# Patient Record
Sex: Female | Born: 1940 | Race: White | Hispanic: No | Marital: Married | Smoking: Never smoker
Health system: Southern US, Community
[De-identification: ages and names within clinical notes are randomized; demographics above are authoritative.]

## PROBLEM LIST (undated history)

## (undated) DIAGNOSIS — K219 Gastro-esophageal reflux disease without esophagitis: Secondary | ICD-10-CM

## (undated) DIAGNOSIS — C4491 Basal cell carcinoma of skin, unspecified: Secondary | ICD-10-CM

## (undated) DIAGNOSIS — T7840XA Allergy, unspecified, initial encounter: Secondary | ICD-10-CM

## (undated) DIAGNOSIS — M1990 Unspecified osteoarthritis, unspecified site: Secondary | ICD-10-CM

## (undated) DIAGNOSIS — H269 Unspecified cataract: Secondary | ICD-10-CM

## (undated) DIAGNOSIS — G4730 Sleep apnea, unspecified: Secondary | ICD-10-CM

## (undated) DIAGNOSIS — I10 Essential (primary) hypertension: Secondary | ICD-10-CM

## (undated) DIAGNOSIS — L570 Actinic keratosis: Secondary | ICD-10-CM

## (undated) DIAGNOSIS — M4800 Spinal stenosis, site unspecified: Secondary | ICD-10-CM

## (undated) HISTORY — DX: Allergy, unspecified, initial encounter: T78.40XA

## (undated) HISTORY — DX: Unspecified cataract: H26.9

## (undated) HISTORY — PX: FOOT SURGERY: SHX648

## (undated) HISTORY — DX: Gastro-esophageal reflux disease without esophagitis: K21.9

## (undated) HISTORY — DX: Actinic keratosis: L57.0

## (undated) HISTORY — DX: Basal cell carcinoma of skin, unspecified: C44.91

## (undated) HISTORY — PX: BREAST CYST ASPIRATION: SHX578

## (undated) HISTORY — DX: Sleep apnea, unspecified: G47.30

## (undated) HISTORY — PX: OTHER SURGICAL HISTORY: SHX169

## (undated) HISTORY — PX: BREAST SURGERY: SHX581

## (undated) HISTORY — PX: EYE SURGERY: SHX253

## (undated) HISTORY — PX: BREAST EXCISIONAL BIOPSY: SUR124

## (undated) HISTORY — DX: Spinal stenosis, site unspecified: M48.00

## (undated) HISTORY — DX: Unspecified osteoarthritis, unspecified site: M19.90

## (undated) HISTORY — DX: Essential (primary) hypertension: I10

---

## 2000-06-09 DIAGNOSIS — J309 Allergic rhinitis, unspecified: Secondary | ICD-10-CM | POA: Insufficient documentation

## 2000-07-16 DIAGNOSIS — G4730 Sleep apnea, unspecified: Secondary | ICD-10-CM | POA: Insufficient documentation

## 2006-01-21 DIAGNOSIS — E785 Hyperlipidemia, unspecified: Secondary | ICD-10-CM | POA: Insufficient documentation

## 2013-01-26 DIAGNOSIS — K922 Gastrointestinal hemorrhage, unspecified: Secondary | ICD-10-CM | POA: Insufficient documentation

## 2016-04-09 DIAGNOSIS — N281 Cyst of kidney, acquired: Secondary | ICD-10-CM | POA: Insufficient documentation

## 2016-04-09 DIAGNOSIS — J310 Chronic rhinitis: Secondary | ICD-10-CM | POA: Insufficient documentation

## 2016-04-09 DIAGNOSIS — L509 Urticaria, unspecified: Secondary | ICD-10-CM | POA: Insufficient documentation

## 2016-04-09 DIAGNOSIS — K635 Polyp of colon: Secondary | ICD-10-CM | POA: Insufficient documentation

## 2016-04-09 DIAGNOSIS — M8580 Other specified disorders of bone density and structure, unspecified site: Secondary | ICD-10-CM | POA: Insufficient documentation

## 2016-04-09 DIAGNOSIS — I10 Essential (primary) hypertension: Secondary | ICD-10-CM | POA: Insufficient documentation

## 2016-04-09 DIAGNOSIS — M47816 Spondylosis without myelopathy or radiculopathy, lumbar region: Secondary | ICD-10-CM | POA: Insufficient documentation

## 2016-04-09 DIAGNOSIS — M1990 Unspecified osteoarthritis, unspecified site: Secondary | ICD-10-CM | POA: Insufficient documentation

## 2016-05-27 DIAGNOSIS — M5416 Radiculopathy, lumbar region: Secondary | ICD-10-CM | POA: Insufficient documentation

## 2016-07-07 HISTORY — PX: JOINT REPLACEMENT: SHX530

## 2017-04-13 DIAGNOSIS — R609 Edema, unspecified: Secondary | ICD-10-CM | POA: Insufficient documentation

## 2017-06-23 DIAGNOSIS — R55 Syncope and collapse: Secondary | ICD-10-CM | POA: Insufficient documentation

## 2018-01-13 DIAGNOSIS — K5900 Constipation, unspecified: Secondary | ICD-10-CM | POA: Insufficient documentation

## 2018-10-05 ENCOUNTER — Other Ambulatory Visit: Payer: Self-pay

## 2018-10-05 ENCOUNTER — Encounter: Payer: Self-pay | Admitting: Family Medicine

## 2018-10-05 ENCOUNTER — Telehealth: Payer: Self-pay

## 2018-10-05 ENCOUNTER — Ambulatory Visit (INDEPENDENT_AMBULATORY_CARE_PROVIDER_SITE_OTHER): Payer: Medicare Other | Admitting: Family Medicine

## 2018-10-05 VITALS — BP 150/78 | HR 76 | Temp 98.6°F | Ht 64.0 in | Wt 183.0 lb

## 2018-10-05 DIAGNOSIS — Z8739 Personal history of other diseases of the musculoskeletal system and connective tissue: Secondary | ICD-10-CM

## 2018-10-05 DIAGNOSIS — Z96651 Presence of right artificial knee joint: Secondary | ICD-10-CM

## 2018-10-05 DIAGNOSIS — M4800 Spinal stenosis, site unspecified: Secondary | ICD-10-CM | POA: Diagnosis not present

## 2018-10-05 DIAGNOSIS — Z8711 Personal history of peptic ulcer disease: Secondary | ICD-10-CM

## 2018-10-05 DIAGNOSIS — S39012A Strain of muscle, fascia and tendon of lower back, initial encounter: Secondary | ICD-10-CM

## 2018-10-05 DIAGNOSIS — M4316 Spondylolisthesis, lumbar region: Secondary | ICD-10-CM | POA: Diagnosis not present

## 2018-10-05 DIAGNOSIS — M5416 Radiculopathy, lumbar region: Secondary | ICD-10-CM

## 2018-10-05 DIAGNOSIS — I10 Essential (primary) hypertension: Secondary | ICD-10-CM

## 2018-10-05 DIAGNOSIS — G4733 Obstructive sleep apnea (adult) (pediatric): Secondary | ICD-10-CM

## 2018-10-05 DIAGNOSIS — Z8719 Personal history of other diseases of the digestive system: Secondary | ICD-10-CM

## 2018-10-05 MED ORDER — METHOCARBAMOL 500 MG PO TABS: 500.0000 mg | ORAL_TABLET | Freq: Four times a day (QID) | ORAL | 1 refills | Status: DC

## 2018-10-05 NOTE — Progress Notes (Signed)
Michelle Lutz  MRN: [id number] DOB: 10/22/1940  Subjective:  HPI  The patient is a 78 year old female that has just moved to town to be closer to her son.  She has been moving boxes and opening boxes during this time of her move.  She states for 5 days now she has been experiencing upper back/neck/shoulder pain.  She describes it as back pain pain but points to her left shoulder area going toward the area between her shoulder blades. She has prescriptions for Hydrocodone and Tramadol for her knee pain.  She states she took one of the Tramadol last night and it enabled her to roll over in bed and sleep a little better than she had been   Past Surgical History:  Procedure Laterality Date  . BREAST SURGERY    . CESAREAN SECTION    . EYE SURGERY    . FOOT SURGERY    . JOINT REPLACEMENT Right 07/07/2016   Partial R. knee in 2008 then complete in 2018  . uterine prolapse repair     Tacking uterus   Past Medical History:  Diagnosis Date  . Allergy   . Arthritis   . Cataract   . GERD (gastroesophageal reflux disease)   . Hypertension   . Osteoarthritis   . Sleep apnea   . Spinal stenosis    Family History  Problem Relation Age of Onset  . Heart disease Mother   . Kidney disease Mother   . Macular degeneration Mother   . Arthritis Father   . Heart disease Father   . Arthritis Brother   . Heart disease Brother   . Hypertension Brother    Social History   Socioeconomic History  . Marital status: Married    Spouse name: Not on file  . Number of children: 2  . Years of education: Not on file  . Highest education level: Not on file  Occupational History  . 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 Financial resource strain: Not on file  . Food insecurity:    Worry: Not on file    Inability: Not on file  . Transportation needs:    Medical: Not on file    Non-medical: Not on file  Tobacco Use  .  Smoking status: Never Smoker  . Smokeless tobacco: Never Used  Substance and Sexual Activity  . Alcohol use: Not Currently  . Drug use: Never  . Sexual activity: Not on file  Lifestyle  . Physical activity:    Days per week: Not on file    Minutes per session: Not on file  . Stress: Not on file  Relationships  . Social connections:    Talks on phone: Not on file    Gets together: Not on file    Attends religious service: Not on file    Active member of club or organization: Not on file    Attends meetings of clubs or organizations: Not on file    Relationship status: Not on file  . Intimate partner violence:    Fear of current or ex partner: Not on file    Emotionally abused: Not on file    Physically abused: Not on file    Forced sexual activity: Not on file  Other Topics Concern  . Not on file  Social History Narrative  . Not on file   Outpatient Encounter Medications as of 10/05/2018  Medication Sig  . Biotin 1000 MCG tablet Take 1,000 mcg by mouth 3 (three) times daily.  . Calcium Carb-Cholecalciferol (CALCIUM 600 + D PO) Take by mouth.  . carboxymethylcellulose (REFRESH PLUS) 0.5 % SOLN 1 drop 3 (three) times daily as needed.  . cetirizine (ZYRTEC) 10 MG tablet Take 10 mg by mouth daily.  . Cholecalciferol (VITAMIN D-1000 MAX ST PO) Take by mouth.  . fluticasone (FLONASE) 50 MCG/ACT nasal spray Place into both nostrils daily.  . hydrochlorothiazide (MICROZIDE) 12.5 MG capsule Take 12.5 mg by mouth daily.  Marland Kitchen HYDROcodone-acetaminophen (NORCO/VICODIN) 5-325 MG tablet Take 1 tablet by mouth every 6 (six) hours as needed for moderate pain.  . metroNIDAZOLE (METROCREAM) 0.75 % cream Apply topically 2 (two) times daily.  . montelukast (SINGULAIR) 10 MG tablet Take 10 mg by mouth at bedtime.  . Multiple Vitamin (MULTIVITAMIN) capsule Take 1 capsule by mouth daily.  Marland Kitchen omeprazole (PRILOSEC) 20 MG capsule Take 20 mg by mouth daily.  . pravastatin (PRAVACHOL) 40 MG tablet Take 40  mg by mouth daily.  . Probiotic Product (ALIGN) 4 MG CAPS Take by mouth.  . traMADol (ULTRAM) 50 MG tablet Take by mouth every 6 (six) hours as needed.  . verapamil (CALAN-SR) 240 MG CR tablet Take 240 mg by mouth at bedtime.  Dema Severin Petrolatum-Mineral Oil (ARTIFICIAL TEARS) ointment Apply to eye as needed.  . zolpidem (AMBIEN) 5 MG tablet Take 5 mg by mouth at bedtime as needed for sleep.   No facility-administered encounter medications on file as of 10/05/2018.    Allergies  Allergen Reactions  . Zocor [Simvastatin]     Caused pain   Review of Systems  Musculoskeletal: Positive for back pain, joint pain and neck pain.    Objective:  BP (!) 150/78 (BP Location: Right Arm, Patient Position: Sitting, Cuff Size: Normal)   Pulse 76   Temp 98.6 F (37 C) (Oral)   Ht 5\' 4"  (1.626 m)   Wt 183 lb (83 kg)   BMI 31.41 kg/m   Physical Exam  Constitutional: She is oriented to person, place, and time and well-developed, well-nourished, and in no distress.  HENT:  Head: Normocephalic.  Eyes: Conjunctivae are normal.  Neck:  Pain and stiffness to test cervical ROM due to spondylosis with spinal stenosis.  Cardiovascular: Normal rate and regular rhythm.  Pulmonary/Chest: Effort normal and breath sounds normal.  Abdominal: Soft. Bowel sounds are normal.  Musculoskeletal:     Comments: Well healed scar from right knee replacement 07-07-16. Cervical and lumbar pain with radiculopathy. Stiff and aching pains in mid back that is worse with reaching overhead or twisting. Posterior cervical tenderness to palpation into the upper back/posterior shoulder region. Arthritic deformities of fingers diagnosed as osteoarthritis without RA - enlarged PIP joints with stiffness.  Neurological: She is alert and oriented to person, place, and time.  Skin: No rash noted.  Psychiatric: Mood, affect and judgment normal.    Assessment and Plan :   1. Back strain, initial encounter Moved to [place] from [facility] to be  closer to her son recently and has been moving/lifting/opening boxes the past couple weeks.. Having sharp pains in the mid back region that is worse with reaching overhead or twisting the past 5 days. Had some leftover Tramadol (for her spinal stenosis with radiculopathy), took one last night and it allowed her to be able to roll over in bed without as much spasm.Muscular soreness today to examination. Will give muscle relaxer and encouraged to  try Salonpas Lidocaine patches or Aspercreme with Lidocaine (fears NSAID's with history of PUD). Recheck as needed. - methocarbamol (ROBAXIN) 500 MG tablet; Take 1 tablet (500 mg total) by mouth 4 (four) times daily.  Dispense: 30 tablet; Refill: 1  2. Spondylolisthesis of lumbar region Confirmed by MRI and x-ray imaging with spinal stenosis and lumbar radiculopathy.  3. Multilevel spinal stenosis Confirmed on cervical and lumbar MRI's in the past.  4. 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 X-rays in 2005 reported as erosive disease in hands/fingers but negative for RA.  5. Lumbar radiculopathy Had 4 epidural injections in [place] (last one 04-05-18). Feels stable and tolerable now.Presently controlled with Gabapentin BID (can't remember exact dosage and did not bring medications today).  6. History of total knee arthroplasty, right Had partial right knee replacement in 2008 then total arthroplasty on 07-07-26. No significant discomfort in the knee now.  7. Obstructive sleep apnea syndrome Diagnosed in 2002. Had follow up sleep study 03-31-18 and restarted the CPAP with auto-titration 06-08-18.  8. Essential hypertension Tolerating Verapamil-SR 240 mg at bedtime, HCTZ 12.5 mg qd and Doxazocin qd (can't remember dosage and did not bring it with her today).

## 2018-10-05 NOTE — Telephone Encounter (Signed)
Sorry, I overlooked it at lunch. Just sent it to the [facility].

## 2018-10-05 NOTE — Telephone Encounter (Signed)
Patient called and stated Michelle Lutz suppose to had send in a muscle relaxer in for her and the pharmacy stated they have not received any medication orders. She was seen in office today 10/05/2018. Please advise.

## 2018-10-06 NOTE — Telephone Encounter (Signed)
Patient was advised that medication was send into pharmacy. 

## 2018-10-07 ENCOUNTER — Encounter: Payer: Self-pay | Admitting: Family Medicine

## 2018-10-07 DIAGNOSIS — Z8719 Personal history of other diseases of the digestive system: Secondary | ICD-10-CM | POA: Insufficient documentation

## 2018-10-07 DIAGNOSIS — M4800 Spinal stenosis, site unspecified: Secondary | ICD-10-CM | POA: Insufficient documentation

## 2018-10-07 DIAGNOSIS — Z8739 Personal history of other diseases of the musculoskeletal system and connective tissue: Secondary | ICD-10-CM | POA: Insufficient documentation

## 2018-10-07 DIAGNOSIS — Z8711 Personal history of peptic ulcer disease: Secondary | ICD-10-CM | POA: Insufficient documentation

## 2018-10-07 DIAGNOSIS — M5416 Radiculopathy, lumbar region: Secondary | ICD-10-CM | POA: Insufficient documentation

## 2018-10-07 DIAGNOSIS — Z96651 Presence of right artificial knee joint: Secondary | ICD-10-CM | POA: Insufficient documentation

## 2019-02-08 ENCOUNTER — Ambulatory Visit (INDEPENDENT_AMBULATORY_CARE_PROVIDER_SITE_OTHER): Payer: Medicare Other | Admitting: Family Medicine

## 2019-02-08 ENCOUNTER — Encounter: Payer: Self-pay | Admitting: Family Medicine

## 2019-02-08 VITALS — BP 128/84 | HR 77 | Temp 96.9°F | Resp 18 | Wt 178.4 lb

## 2019-02-08 DIAGNOSIS — Z6830 Body mass index (BMI) 30.0-30.9, adult: Secondary | ICD-10-CM

## 2019-02-08 DIAGNOSIS — Z1211 Encounter for screening for malignant neoplasm of colon: Secondary | ICD-10-CM

## 2019-02-08 DIAGNOSIS — I10 Essential (primary) hypertension: Secondary | ICD-10-CM

## 2019-02-08 DIAGNOSIS — E785 Hyperlipidemia, unspecified: Secondary | ICD-10-CM

## 2019-02-08 DIAGNOSIS — M4800 Spinal stenosis, site unspecified: Secondary | ICD-10-CM

## 2019-02-08 DIAGNOSIS — Z23 Encounter for immunization: Secondary | ICD-10-CM

## 2019-02-08 DIAGNOSIS — H9191 Unspecified hearing loss, right ear: Secondary | ICD-10-CM

## 2019-02-08 DIAGNOSIS — E6609 Other obesity due to excess calories: Secondary | ICD-10-CM

## 2019-02-08 DIAGNOSIS — Z0000 Encounter for general adult medical examination without abnormal findings: Secondary | ICD-10-CM

## 2019-02-08 NOTE — Progress Notes (Signed)
Patient: Michelle Lutz, Female    DOB: 15-Jun-1940, 78 y.o.   MRN: [id number] Visit Date: 02/08/2019  Today's Provider: Wilhemena Durie, MD   Chief Complaint  Patient presents with  . Annual Exam   Subjective:     Annual physical exam Michelle Lutz is a 78 y.o. female who presents today for health maintenance and complete physical. She feels well today. She reports exercising by walking a few times a week. She reports she is sleeping fairly well. Last colonoscopy Nov 2010. -----------------------------------------------------------------   Review of Systems  Constitutional: Negative.   HENT: Positive for hearing loss.        Left ear hearing loss.  Eyes: Negative.   Respiratory: Negative.   Cardiovascular: Negative.   Gastrointestinal: Negative.   Endocrine: Negative.   Genitourinary: Negative.   Musculoskeletal: Positive for back pain.  Allergic/Immunologic: Negative.   Neurological: Negative.   Hematological: Negative.   Psychiatric/Behavioral: Negative.     Social History      She  reports that she has never smoked. She has never used smokeless tobacco. She reports previous alcohol use. She reports that she does not use drugs.       Social History   Socioeconomic History  . Marital status: Married    Spouse name: Not on file  . Number of children: 2  . Years of education: Not on file  . Highest education level: Not on file  Occupational History  . 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 Financial resource strain: Not on file  . Food insecurity    Worry: Not on file    Inability: Not on file  . Transportation needs    Medical: Not on file    Non-medical: Not on file  Tobacco Use  . Smoking status: Never Smoker  . Smokeless tobacco: Never Used  Substance and Sexual Activity  . Alcohol use: Not Currently  . Drug use: Never  . Sexual activity: Not on file  Lifestyle  .  Physical activity    Days per week: Not on file    Minutes per session: Not on file  . Stress: Not on file  Relationships  . Social Herbalist on phone: Not on file    Gets together: Not on file    Attends religious service: Not on file    Active member of club or organization: Not on file    Attends meetings of clubs or organizations: Not on file    Relationship status: Not on file  Other Topics Concern  . Not on file  Social History Narrative  . Not on file    Past Medical History:  Diagnosis Date  . Allergy   . Arthritis   . Cataract   . GERD (gastroesophageal reflux disease)   . Hypertension   . Osteoarthritis   . Sleep apnea   . Spinal stenosis      Patient Active Problem List   Diagnosis Date Noted  . Spondylolisthesis of lumbar region 10/07/2018  . 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 History of arthritis 10/07/2018  . Lumbar radiculopathy 10/07/2018  . History of total knee arthroplasty, right 10/07/2018  . Personal history of gastric ulcer 10/07/2018  . Sleep apnea 10/07/2018  . Essential hypertension 10/07/2018  Pt is G105P2 with a stillborn daughter after PROM. 2 healthy sons.  Past Surgical History:  Procedure  Laterality Date  . BREAST SURGERY    . CESAREAN SECTION    . EYE SURGERY    . FOOT SURGERY    . JOINT REPLACEMENT Right 07/07/2016   Partial R. knee in 2008 then complete in 2018  . uterine prolapse repair     Tacking uterus    Family History        Family Status  Relation Name Status  . Mother  Deceased  . Father  Deceased  . Brother  (Not Specified)        Her family history includes Arthritis in her brother and father; COPD in her father; Heart disease in her brother, father, and mother; Hypertension in her brother; Kidney disease in her mother; Macular degeneration in her mother.      Allergies  Allergen Reactions  . Benazepril Cough  . Zocor [Simvastatin]     Caused pain     Current Outpatient Medications:   .  Biotin 1000 MCG tablet, Take 1,000 mcg by mouth 3 (three) times daily., Disp: , Rfl:  .  Calcium Carb-Cholecalciferol (CALCIUM 600 + D PO), Take by mouth., Disp: , Rfl:  .  carboxymethylcellulose (REFRESH PLUS) 0.5 % SOLN, 1 drop 3 (three) times daily as needed., Disp: , Rfl:  .  cetirizine (ZYRTEC) 10 MG tablet, Take 10 mg by mouth daily., Disp: , Rfl:  .  Cholecalciferol (VITAMIN D-1000 MAX ST PO), Take by mouth., Disp: , Rfl:  .  doxazosin (CARDURA) 2 MG tablet, Take 2 mg by mouth daily., Disp: , Rfl:  .  fluticasone (FLONASE) 50 MCG/ACT nasal spray, Place into both nostrils daily., Disp: , Rfl:  .  gabapentin (NEURONTIN) 100 MG capsule, Take 100 mg by mouth 2 (two) times daily., Disp: , Rfl:  .  hydrochlorothiazide (MICROZIDE) 12.5 MG capsule, Take 12.5 mg by mouth daily., Disp: , Rfl:  .  montelukast (SINGULAIR) 10 MG tablet, Take 10 mg by mouth at bedtime., Disp: , Rfl:  .  Multiple Vitamin (MULTIVITAMIN) capsule, Take 1 capsule by mouth daily., Disp: , Rfl:  .  omeprazole (PRILOSEC) 20 MG capsule, Take 20 mg by mouth daily., Disp: , Rfl:  .  pravastatin (PRAVACHOL) 40 MG tablet, Take 40 mg by mouth daily., Disp: , Rfl:  .  Probiotic Product (ALIGN) 4 MG CAPS, Take by mouth., Disp: , Rfl:  .  sodium chloride (MURO 128) 5 % ophthalmic solution, 1 drop as needed for eye irritation., Disp: , Rfl:  .  traMADol (ULTRAM) 50 MG tablet, Take by mouth every 6 (six) hours as needed., Disp: , Rfl:  .  White Petrolatum-Mineral Oil (ARTIFICIAL TEARS) ointment, Apply to eye as needed., Disp: , Rfl:  .  HYDROcodone-acetaminophen (NORCO/VICODIN) 5-325 MG tablet, Take 1 tablet by mouth every 6 (six) hours as needed for moderate pain., Disp: , Rfl:  .  methocarbamol (ROBAXIN) 500 MG tablet, Take 1 tablet (500 mg total) by mouth 4 (four) times daily. (Patient not taking: Reported on 02/08/2019), Disp: 30 tablet, Rfl: 1 .  verapamil (CALAN-SR) 240 MG CR tablet, Take 240 mg by mouth at bedtime., Disp: , Rfl:   .  zolpidem (AMBIEN) 5 MG tablet, Take 5 mg by mouth at bedtime as needed for sleep., Disp: , Rfl:    Patient Care Team: Chrismon, Vickki Muff, PA as PCP - General (Family Medicine)    Objective:    Vitals: BP 128/84 (BP Location: Left Arm, Patient Position: Sitting, Cuff Size: Large)   Pulse 77  Temp (!) 96.9 F (36.1 C) (Temporal)   Resp 18   Wt 178 lb 6.4 oz (80.9 kg)   SpO2 97%   BMI 30.62 kg/m    Vitals:   02/08/19 0854  BP: 128/84  Pulse: 77  Resp: 18  Temp: (!) 96.9 F (36.1 C)  TempSrc: Temporal  SpO2: 97%  Weight: 178 lb 6.4 oz (80.9 kg)     Physical Exam Vitals signs reviewed.  Constitutional:      Appearance: She is obese.  HENT:     Head: Normocephalic and atraumatic.     Right Ear: External ear normal.     Left Ear: External ear normal.  Eyes:     General: No scleral icterus.    Conjunctiva/sclera: Conjunctivae normal.  Cardiovascular:     Rate and Rhythm: Normal rate and regular rhythm.     Heart sounds: Normal heart sounds.  Pulmonary:     Effort: Pulmonary effort is normal.     Breath sounds: Normal breath sounds.  Chest:     Breasts:        Right: Normal.        Left: Normal.  Abdominal:     Palpations: Abdomen is soft.  Lymphadenopathy:     Cervical: No cervical adenopathy.  Skin:    General: Skin is warm and dry.  Neurological:     General: No focal deficit present.     Mental Status: She is alert and oriented to person, place, and time.  Psychiatric:        Mood and Affect: Mood normal.        Behavior: Behavior normal.        Thought Content: Thought content normal.        Judgment: Judgment normal.      Depression Screen PHQ 2/9 Scores 02/08/2019 10/05/2018  PHQ - 2 Score 0 0  PHQ- 9 Score 0 -       Assessment & Plan:     Routine Health Maintenance and Physical Exam  Exercise Activities and Dietary recommendations Goals   None      There is no immunization history on file for this patient.  Health  Maintenance  Topic Date Due  . TETANUS/TDAP  07/26/1960  . DEXA SCAN  07/27/2006  . PNA vac Low Risk Adult (1 of 2 - PCV13) 07/27/2006  . INFLUENZA VACCINE  12/25/2018     Discussed health benefits of physical activity, and encouraged her to engage in regular exercise appropriate for her age and condition.   1. Annual physical exam G3P3. - TSH  2. Need for immunization against influenza  - Flu vaccine HIGH DOSE PF  3. Encounter for screening colonoscopy Last was November 2010 - Ambulatory referral to Gastroenterology  4. Spinal stenosis, unspecified spinal region Refer to Pain clinic as she has benefited from shots before. - Ambulatory referral to Pain Clinic  5. Hearing loss of right ear, unspecified hearing loss type  - Ambulatory referral to ENT  6. Essential hypertension  - CBC w/Diff/Platelet - Comp. Metabolic Panel (12) - Lipid Profile  7. Hyperlipidemia, unspecified hyperlipidemia type  - Comp. Metabolic Panel (12) - Lipid Profile 8.Obesity With HTN/LBP/HLD --------------------------------------------------------------------    Wilhemena Durie, MD  [facility]

## 2019-02-09 LAB — COMP. METABOLIC PANEL (12)
AST: 16 IU/L (ref 0–40)
Albumin/Globulin Ratio: 1.9 (ref 1.2–2.2)
Albumin: 4.3 g/dL (ref 3.7–4.7)
Alkaline Phosphatase: 77 IU/L (ref 39–117)
BUN/Creatinine Ratio: 19 (ref 12–28)
BUN: 15 mg/dL (ref 8–27)
Bilirubin Total: 0.5 mg/dL (ref 0.0–1.2)
Calcium: 9.2 mg/dL (ref 8.7–10.3)
Chloride: 102 mmol/L (ref 96–106)
Creatinine, Ser: 0.81 mg/dL (ref 0.57–1.00)
GFR calc Af Amer: 81 mL/min/{1.73_m2} (ref >59)
GFR calc non Af Amer: 70 mL/min/{1.73_m2} (ref >59)
Globulin, Total: 2.3 g/dL (ref 1.5–4.5)
Glucose: 100 mg/dL — ABNORMAL HIGH (ref 65–99)
Potassium: 4.2 mmol/L (ref 3.5–5.2)
Sodium: 141 mmol/L (ref 134–144)
Total Protein: 6.6 g/dL (ref 6.0–8.5)

## 2019-02-09 LAB — CBC WITH DIFFERENTIAL/PLATELET
Basophils Absolute: 0 10*3/uL (ref 0.0–0.2)
Basos: 1 %
EOS (ABSOLUTE): 0.1 10*3/uL (ref 0.0–0.4)
Eos: 1 %
Hematocrit: 39.1 % (ref 34.0–46.6)
Hemoglobin: 12.6 g/dL (ref 11.1–15.9)
Immature Grans (Abs): 0 10*3/uL (ref 0.0–0.1)
Immature Granulocytes: 0 %
Lymphocytes Absolute: 1.6 10*3/uL (ref 0.7–3.1)
Lymphs: 32 %
MCH: 30.7 pg (ref 26.6–33.0)
MCHC: 32.2 g/dL (ref 31.5–35.7)
MCV: 95 fL (ref 79–97)
Monocytes Absolute: 0.5 10*3/uL (ref 0.1–0.9)
Monocytes: 9 %
Neutrophils Absolute: 2.8 10*3/uL (ref 1.4–7.0)
Neutrophils: 57 %
Platelets: 231 10*3/uL (ref 150–450)
RBC: 4.11 x10E6/uL (ref 3.77–5.28)
RDW: 12.5 % (ref 11.7–15.4)
WBC: 5 10*3/uL (ref 3.4–10.8)

## 2019-02-09 LAB — LIPID PANEL
Chol/HDL Ratio: 2.5 ratio (ref 0.0–4.4)
Cholesterol, Total: 153 mg/dL (ref 100–199)
HDL: 61 mg/dL (ref >39)
LDL Chol Calc (NIH): 75 mg/dL (ref 0–99)
Triglycerides: 90 mg/dL (ref 0–149)
VLDL Cholesterol Cal: 17 mg/dL (ref 5–40)

## 2019-02-09 LAB — TSH: TSH: 3.71 u[IU]/mL (ref 0.450–4.500)

## 2019-02-10 ENCOUNTER — Telehealth: Payer: Self-pay | Admitting: Family Medicine

## 2019-02-10 NOTE — Telephone Encounter (Signed)
Pt called saying she noticed that on her after visit summary that Michelle Lutz is listed as her primary and it should be Dr. Rosanna Randy.  Also she notice that it states she has hearing loss in her right ear and should say her left ear.  Con Memos

## 2019-02-11 ENCOUNTER — Other Ambulatory Visit: Payer: Self-pay

## 2019-02-11 MED ORDER — PRAVASTATIN SODIUM 40 MG PO TABS: 40.0000 mg | ORAL_TABLET | Freq: Every day | ORAL | 11 refills | Status: DC

## 2019-02-11 NOTE — Telephone Encounter (Signed)
Spoke to patient regarding questions.

## 2019-02-14 ENCOUNTER — Telehealth: Payer: Self-pay

## 2019-02-14 NOTE — Telephone Encounter (Signed)
-----   Message from Jerrol Banana., MD sent at 02/10/2019  9:30 AM EDT ----- Labs OK

## 2019-02-14 NOTE — Telephone Encounter (Signed)
Patient notified of lab results

## 2019-02-15 ENCOUNTER — Other Ambulatory Visit: Payer: Self-pay

## 2019-02-15 ENCOUNTER — Telehealth: Payer: Self-pay

## 2019-02-15 DIAGNOSIS — Z1211 Encounter for screening for malignant neoplasm of colon: Secondary | ICD-10-CM

## 2019-02-15 MED ORDER — NA SULFATE-K SULFATE-MG SULF 17.5-3.13-1.6 GM/177ML PO SOLN: 1.0000 | Freq: Once | ORAL | 0 refills | Status: AC

## 2019-02-15 NOTE — Telephone Encounter (Signed)
Gastroenterology Pre-Procedure Review  Request Date: 03/01/19 Requesting Physician: Dr. Vicente Males  PATIENT REVIEW QUESTIONS: The patient responded to the following health history questions as indicated:    1. Are you having any GI issues? No 2. Do you have a personal history of Polyps? 1 long time ago pt stated 3. Do you have a family history of Colon Cancer or Polyps? No 4. Diabetes Mellitus?No 5. Joint replacements in the past 12 months?No 6. Major health problems in the past 3 months?No 7. Any artificial heart valves, MVP, or defibrillator?No    MEDICATIONS & ALLERGIES:    Patient reports the following regarding taking any anticoagulation/antiplatelet therapy:   Plavix, Coumadin, Eliquis, Xarelto, Lovenox, Pradaxa, Brilinta, or Effient? No Aspirin?No  Patient confirms/reports the following medications:  Current Outpatient Medications  Medication Sig Dispense Refill  . Biotin 1000 MCG tablet Take 1,000 mcg by mouth 3 (three) times daily.    . Calcium Carb-Cholecalciferol (CALCIUM 600 + D PO) Take by mouth.    . carboxymethylcellulose (REFRESH PLUS) 0.5 % SOLN 1 drop 3 (three) times daily as needed.    . cetirizine (ZYRTEC) 10 MG tablet Take 10 mg by mouth daily.    . Cholecalciferol (VITAMIN D-1000 MAX ST PO) Take by mouth.    . doxazosin (CARDURA) 2 MG tablet Take 2 mg by mouth daily.    . fluticasone (FLONASE) 50 MCG/ACT nasal spray Place into both nostrils daily.    Marland Kitchen gabapentin (NEURONTIN) 100 MG capsule Take 100 mg by mouth 2 (two) times daily.    . hydrochlorothiazide (MICROZIDE) 12.5 MG capsule Take 12.5 mg by mouth daily.    Marland Kitchen HYDROcodone-acetaminophen (NORCO/VICODIN) 5-325 MG tablet Take 1 tablet by mouth every 6 (six) hours as needed for moderate pain.    . methocarbamol (ROBAXIN) 500 MG tablet Take 1 tablet (500 mg total) by mouth 4 (four) times daily. (Patient not taking: Reported on 02/08/2019) 30 tablet 1  . montelukast (SINGULAIR) 10 MG tablet Take 10 mg by mouth at  bedtime.    . Multiple Vitamin (MULTIVITAMIN) capsule Take 1 capsule by mouth daily.    Marland Kitchen omeprazole (PRILOSEC) 20 MG capsule Take 20 mg by mouth daily.    . pravastatin (PRAVACHOL) 40 MG tablet Take 1 tablet (40 mg total) by mouth daily. 30 tablet 11  . Probiotic Product (ALIGN) 4 MG CAPS Take by mouth.    . sodium chloride (MURO 128) 5 % ophthalmic solution 1 drop as needed for eye irritation.    . traMADol (ULTRAM) 50 MG tablet Take by mouth every 6 (six) hours as needed.    . verapamil (CALAN-SR) 240 MG CR tablet Take 240 mg by mouth at bedtime.    Dema Severin Petrolatum-Mineral Oil (ARTIFICIAL TEARS) ointment Apply to eye as needed.    . zolpidem (AMBIEN) 5 MG tablet Take 5 mg by mouth at bedtime as needed for sleep.     No current facility-administered medications for this visit.     Patient confirms/reports the following allergies:  Allergies  Allergen Reactions  . Benazepril Cough  . Zocor [Simvastatin]     Caused pain    No orders of the defined types were placed in this encounter.   AUTHORIZATION INFORMATION Primary Insurance: 1D#: Group #:  Secondary Insurance: 1D#: Group #:  SCHEDULE INFORMATION: Date: 03/01/19 Time: Location:[facility]

## 2019-02-16 ENCOUNTER — Other Ambulatory Visit: Payer: Self-pay

## 2019-02-16 ENCOUNTER — Telehealth: Payer: Self-pay

## 2019-02-16 MED ORDER — NA SULFATE-K SULFATE-MG SULF 17.5-3.13-1.6 GM/177ML PO SOLN: 1.0000 | Freq: Once | ORAL | 0 refills | Status: AC

## 2019-02-16 MED ORDER — GOLYTELY 236 G PO SOLR: 4000.0000 mL | Freq: Once | ORAL | 0 refills | Status: AC

## 2019-02-16 NOTE — Telephone Encounter (Signed)
Patients bowel prep has been changed to Golytely.  Patient has been advised that she will need to follow instructions on rx regarding preparing Golytely.  She has been advised to drink 8 oz every 30 minutes until completed the entire contents.  This should be done the evening before her procedure at 5pm.  Thanks Sharyn Lull

## 2019-02-22 ENCOUNTER — Telehealth: Payer: Self-pay

## 2019-02-22 NOTE — Telephone Encounter (Signed)
Patient contacted the office to let us know that she has been advised by her  pysician to take Amoxicillin 500 mg 1 hour before her colonoscopy scheduled on 03/01/19.  I told her I would make note of this in chart to let Dr. Vicente Males know.  This is because of an ortho surgery she had in the past.  Thanks Sharyn Lull

## 2019-02-25 ENCOUNTER — Other Ambulatory Visit
Admission: RE | Admit: 2019-02-25 | Discharge: 2019-02-25 | Disposition: A | Discharge status: Home / Self Care | Payer: Medicare Other | Source: Ambulatory Visit | Attending: Gastroenterology | Admitting: Gastroenterology

## 2019-02-25 DIAGNOSIS — Z20828 Contact with and (suspected) exposure to other viral communicable diseases: Secondary | ICD-10-CM | POA: Insufficient documentation

## 2019-02-25 DIAGNOSIS — Z01812 Encounter for preprocedural laboratory examination: Secondary | ICD-10-CM | POA: Insufficient documentation

## 2019-02-26 LAB — SARS CORONAVIRUS 2 (TAT 6-24 HRS): SARS Coronavirus 2: NEGATIVE

## 2019-02-28 ENCOUNTER — Telehealth: Payer: Self-pay | Admitting: Family Medicine

## 2019-02-28 ENCOUNTER — Telehealth: Payer: Self-pay

## 2019-02-28 ENCOUNTER — Other Ambulatory Visit: Payer: Self-pay | Admitting: Family Medicine

## 2019-02-28 DIAGNOSIS — M4800 Spinal stenosis, site unspecified: Secondary | ICD-10-CM

## 2019-02-28 MED ORDER — PREDNISONE 10 MG (21) PO TBPK: ORAL_TABLET | ORAL | 1 refills | Status: DC

## 2019-02-28 NOTE — Progress Notes (Signed)
Cervical disc disease.

## 2019-02-28 NOTE — Telephone Encounter (Signed)
Pt states she is having neck pain Saturday and all day Sunday.  States muscle relaxers are not helping and would like to see if a prednisone pack sent to [facility] on [address].

## 2019-02-28 NOTE — Telephone Encounter (Signed)
Please review. Thanks!  

## 2019-02-28 NOTE — Telephone Encounter (Signed)
Patient has requested to cancel her tomorrows colonoscopy due to neck pain.  She states its too excruciating for her to try to go through with her colonoscopy.  She has been asked to call office to reschedule when she is ready.  Thanks [employer]

## 2019-03-01 ENCOUNTER — Ambulatory Visit: Admission: RE | Admit: 2019-03-01 | Payer: Medicare Other | Source: Home / Self Care | Admitting: Gastroenterology

## 2019-03-01 ENCOUNTER — Encounter: Payer: Self-pay | Admitting: Intensive Care

## 2019-03-01 ENCOUNTER — Emergency Department
Admission: EM | Admit: 2019-03-01 | Discharge: 2019-03-01 | Disposition: A | Discharge status: Home / Self Care | Payer: Medicare Other | Attending: Emergency Medicine | Admitting: Emergency Medicine

## 2019-03-01 ENCOUNTER — Encounter: Admission: RE | Payer: Self-pay | Source: Home / Self Care

## 2019-03-01 ENCOUNTER — Other Ambulatory Visit: Payer: Self-pay

## 2019-03-01 DIAGNOSIS — I10 Essential (primary) hypertension: Secondary | ICD-10-CM | POA: Diagnosis not present

## 2019-03-01 DIAGNOSIS — R42 Dizziness and giddiness: Secondary | ICD-10-CM | POA: Insufficient documentation

## 2019-03-01 DIAGNOSIS — Z96651 Presence of right artificial knee joint: Secondary | ICD-10-CM | POA: Diagnosis not present

## 2019-03-01 DIAGNOSIS — Z79899 Other long term (current) drug therapy: Secondary | ICD-10-CM | POA: Insufficient documentation

## 2019-03-01 DIAGNOSIS — M542 Cervicalgia: Secondary | ICD-10-CM | POA: Diagnosis present

## 2019-03-01 DIAGNOSIS — R55 Syncope and collapse: Secondary | ICD-10-CM

## 2019-03-01 LAB — URINALYSIS, COMPLETE (UACMP) WITH MICROSCOPIC
Bacteria, UA: NONE SEEN
Bilirubin Urine: NEGATIVE
Glucose, UA: NEGATIVE mg/dL
Hgb urine dipstick: NEGATIVE
Ketones, ur: NEGATIVE mg/dL
Nitrite: NEGATIVE
Protein, ur: 30 mg/dL — AB
Specific Gravity, Urine: 1.025 (ref 1.005–1.030)
pH: 5 (ref 5.0–8.0)

## 2019-03-01 LAB — CBC
HCT: 37.6 % (ref 36.0–46.0)
Hemoglobin: 12.3 g/dL (ref 12.0–15.0)
MCH: 31.7 pg (ref 26.0–34.0)
MCHC: 32.7 g/dL (ref 30.0–36.0)
MCV: 96.9 fL (ref 80.0–100.0)
Platelets: 219 10*3/uL (ref 150–400)
RBC: 3.88 MIL/uL (ref 3.87–5.11)
RDW: 12.7 % (ref 11.5–15.5)
WBC: 7.2 10*3/uL (ref 4.0–10.5)
nRBC: 0 % (ref 0.0–0.2)

## 2019-03-01 LAB — BASIC METABOLIC PANEL
Anion gap: 10 (ref 5–15)
BUN: 17 mg/dL (ref 8–23)
CO2: 26 mmol/L (ref 22–32)
Calcium: 8.9 mg/dL (ref 8.9–10.3)
Chloride: 103 mmol/L (ref 98–111)
Creatinine, Ser: 0.94 mg/dL (ref 0.44–1.00)
GFR calc Af Amer: >60 mL/min (ref >60)
GFR calc non Af Amer: 59 mL/min — ABNORMAL LOW (ref >60)
Glucose, Bld: 164 mg/dL — ABNORMAL HIGH (ref 70–99)
Potassium: 3.3 mmol/L — ABNORMAL LOW (ref 3.5–5.1)
Sodium: 139 mmol/L (ref 135–145)

## 2019-03-01 LAB — TROPONIN I (HIGH SENSITIVITY): Troponin I (High Sensitivity): 5 ng/L (ref <18)

## 2019-03-01 SURGERY — COLONOSCOPY WITH PROPOFOL
Anesthesia: General

## 2019-03-01 MED ORDER — CYCLOBENZAPRINE HCL 5 MG PO TABS: 5.0000 mg | ORAL_TABLET | Freq: Three times a day (TID) | ORAL | 0 refills | Status: DC | PRN

## 2019-03-01 MED ORDER — POTASSIUM CHLORIDE 20 MEQ/15ML (10%) PO SOLN
20.0000 meq | Freq: Once | ORAL | Status: AC
  Administered 2019-03-01: 20 meq via ORAL
  Filled 2019-03-01: qty 15

## 2019-03-01 MED ORDER — OXYCODONE-ACETAMINOPHEN 5-325 MG PO TABS
1.0000 | ORAL_TABLET | Freq: Once | ORAL | Status: AC
  Administered 2019-03-01: 1 via ORAL
  Filled 2019-03-01: qty 1

## 2019-03-01 NOTE — Telephone Encounter (Signed)
Sent in prescription last night.  I have advised her son of this.

## 2019-03-01 NOTE — ED Triage Notes (Signed)
Patient c/o neck pain that started Saturday. HX severe psinal stenosis. Also reports after finishing breakfast she was at table and felt faint. Denies LOC or falling.

## 2019-03-01 NOTE — ED Notes (Signed)
Pt st that she gets dizzy when standing with her eyes closed and often times needs to hold onto things in order to move around. Pt reports previous dx of vertigo "several years ago" and st "but it got better".

## 2019-03-01 NOTE — ED Provider Notes (Signed)
[facility] Emergency Department Provider Note   ____________________________________________   First MD Initiated Contact with Patient 03/01/19 [phone]     (approximate)  I have reviewed the triage vital signs and the nursing notes.   HISTORY  Chief Complaint Neck Pain and Near Syncope    HPI Michelle Lutz is a 78 y.o. female with past medical history of hypertension, osteoarthritis, and spinal stenosis who presents to the ED complaining of neck pain and lightheadedness.  Patient reports that she has been dealing with acute on chronic neck pain over the past 3 days.  She has had similar issues with her neck in the past, is prescribed hydrocodone, but states that it has been gradually worsening over the past few days.  Pain affects primarily the left side of her neck and is exacerbated by movement.  She denies any trauma.  She has not had any numbness or weakness in her extremities.  She has been following with orthopedics while living in [place], previously received steroid injections, but states she has not established care here in [place].  She additionally had an episode of lightheadedness and near syncope while sitting and eating breakfast this morning.  She denies any associated chest pain or shortness of breath, has been feeling well recently with no fevers, chills, cough.        Past Medical History:  Diagnosis Date  . Allergy   . Arthritis   . Cataract   . GERD (gastroesophageal reflux disease)   . Hypertension   . Osteoarthritis   . Sleep apnea   . Spinal stenosis     Patient Active Problem List   Diagnosis Date Noted  . Spondylolisthesis of lumbar region 10/07/2018  . 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 History of arthritis 10/07/2018  . Lumbar radiculopathy 10/07/2018  . History of total knee arthroplasty, right 10/07/2018  . Personal history of gastric ulcer 10/07/2018  . Sleep apnea 10/07/2018  . Essential hypertension  10/07/2018    Past Surgical History:  Procedure Laterality Date  . BREAST SURGERY    . CESAREAN SECTION    . EYE SURGERY    . FOOT SURGERY    . JOINT REPLACEMENT Right 07/07/2016   Partial R. knee in 2008 then complete in 2018  . uterine prolapse repair     Tacking uterus    Prior to Admission medications   Medication Sig Start Date End Date Taking? Authorizing Provider  Biotin 10 MG CAPS Take 10 mg by mouth daily.    Yes [provider]  Calcium Carbonate-Vitamin D3 (CALCIUM 600-D) 600-400 MG-UNIT TABS Take 1 tablet by mouth 2 (two) times daily.   Yes [provider]  carboxymethylcellulose (REFRESH PLUS) 0.5 % SOLN Place 1 drop into both eyes 4 (four) times daily as needed (dry eyes).    Yes [provider]  cetirizine (ZYRTEC) 10 MG tablet Take 10 mg by mouth daily.   Yes [provider]  Cholecalciferol (VITAMIN D-1000 MAX ST) 25 MCG (1000 UT) tablet Take 1,000 Units by mouth daily.    Yes [provider]  Cranberry 450 MG TABS Take 450 mg by mouth daily.   Yes [provider]  diphenoxylate-atropine (LOMOTIL) 2.5-0.025 MG tablet Take 1 tablet by mouth 4 (four) times daily as needed for diarrhea or loose stools.   Yes [provider]  doxazosin (CARDURA) 2 MG tablet Take 2 mg by mouth daily.   Yes [provider]  fluticasone (FLONASE) 50  MCG/ACT nasal spray Place 2 sprays into both nostrils daily.    Yes [provider]  gabapentin (NEURONTIN) 100 MG capsule Take 100 mg by mouth 3 (three) times daily.    Yes [provider]  hydrochlorothiazide (MICROZIDE) 12.5 MG capsule Take 12.5 mg by mouth daily.   Yes [provider]  HYDROcodone-acetaminophen (NORCO) 7.5-325 MG tablet Take 1 tablet by mouth every 6 (six) hours as needed for moderate pain or severe pain.    Yes [provider]  irbesartan (AVAPRO) 300 MG tablet Take 300 mg by mouth daily.   Yes [provider]   montelukast (SINGULAIR) 10 MG tablet Take 10 mg by mouth at bedtime.   Yes [provider]  Multiple Vitamin (MULTIVITAMIN) capsule Take 1 capsule by mouth daily.   Yes [provider]  omeprazole (PRILOSEC) 20 MG capsule Take 20 mg by mouth daily.   Yes [provider]  pravastatin (PRAVACHOL) 40 MG tablet Take 1 tablet (40 mg total) by mouth daily. 02/11/19  Yes Jerrol Banana., MD  predniSONE (STERAPRED UNI-PAK 21 TAB) 10 MG (21) TBPK tablet 6-5-4-3-2-1 taper daily by 1 tablet. 02/28/19  Yes Jerrol Banana., MD  Probiotic Product (ALIGN) 4 MG CAPS Take 4 mg by mouth daily.    Yes [provider]  sodium chloride (MURO 128) 5 % ophthalmic ointment Place 1 application into both eyes as needed for eye irritation.    Yes [provider]  traMADol (ULTRAM) 50 MG tablet Take 50 mg by mouth every 6 (six) hours as needed for moderate pain.    Yes [provider]  zolpidem (AMBIEN) 5 MG tablet Take 5 mg by mouth at bedtime as needed for sleep.   Yes [provider]  cyclobenzaprine (FLEXERIL) 5 MG tablet Take 1 tablet (5 mg total) by mouth 3 (three) times daily as needed for muscle spasms. 03/01/19   Blake Divine, MD  methocarbamol (ROBAXIN) 500 MG tablet Take 1 tablet (500 mg total) by mouth 4 (four) times daily. Patient not taking: Reported on 02/08/2019 10/05/18   Chrismon, Vickki Muff, PA    Allergies Aspirin, Benazepril, and Zocor [simvastatin]  Family History  Problem Relation Age of Onset  . Heart disease Mother   . Kidney disease Mother   . Macular degeneration Mother   . Arthritis Father   . Heart disease Father   . COPD Father   . Arthritis Brother   . Heart disease Brother   . Hypertension Brother     Social History Social History   Tobacco Use  . Smoking status: Never Smoker  . Smokeless tobacco: Never Used  Substance Use Topics  . Alcohol use: Not Currently  . Drug use: Yes    Comment: prescribed  hydrocodone    Review of Systems  Constitutional: No fever/chills Eyes: No visual changes. ENT: No sore throat.  Positive for neck pain. Cardiovascular: Denies chest pain. Respiratory: Denies shortness of breath. Gastrointestinal: No abdominal pain.  No nausea, no vomiting.  No diarrhea.  No constipation. Genitourinary: Negative for dysuria. Musculoskeletal: Negative for back pain. Skin: Negative for rash. Neurological: Negative for headaches, focal weakness or numbness.  Positive for lightheadedness.  ____________________________________________   PHYSICAL EXAM:  VITAL SIGNS: ED Triage Vitals  Enc Vitals Group     BP 03/01/19 0911 (!) 130/92     Pulse Rate 03/01/19 0911 73     Resp 03/01/19 0911 14     Temp 03/01/19 0911 98  F (36.7 C)     Temp Source 03/01/19 0911 Oral     SpO2 03/01/19 0911 98 %     Weight 03/01/19 0908 175 lb (79.4 kg)     Height 03/01/19 0908 5' 4.5" (1.638 m)     Head Circumference --      Peak Flow --      Pain Score 03/01/19 0906 6     Pain Loc --      Pain Edu? --      Excl. in [place]? --     Constitutional: Alert and oriented. Eyes: Conjunctivae are normal. Head: Atraumatic. Nose: No congestion/rhinnorhea. Mouth/Throat: Mucous membranes are moist. Neck: Range of motion limited secondary to pain, no midline cervical spine tenderness, tenderness to palpation over left lateral neck.  No lymphadenopathy, erythema, or warmth. Cardiovascular: Normal rate, regular rhythm. Grossly normal heart sounds. Respiratory: Normal respiratory effort.  No retractions. Lungs CTAB. Gastrointestinal: Soft and nontender. No distention. Genitourinary: deferred Musculoskeletal: No lower extremity tenderness nor edema. Neurologic:  Normal speech and language. No gross focal neurologic deficits are appreciated.  5 out of 5 strength in bilateral upper and lower extremities, sensation intact throughout. Skin:  Skin is warm, dry and intact. No rash noted. Psychiatric:  Mood and affect are normal. Speech and behavior are normal.  ____________________________________________   LABS (all labs ordered are listed, but only abnormal results are displayed)  Labs Reviewed  BASIC METABOLIC PANEL - Abnormal; Notable for the following components:      Result Value   Potassium 3.3 (*)    Glucose, Bld 164 (*)    GFR calc non Af Amer 59 (*)    All other components within normal limits  URINALYSIS, COMPLETE (UACMP) WITH MICROSCOPIC - Abnormal; Notable for the following components:   Color, Urine YELLOW (*)    APPearance HAZY (*)    Protein, ur 30 (*)    Leukocytes,Ua SMALL (*)    All other components within normal limits  CBC  CBG MONITORING, ED  TROPONIN I (HIGH SENSITIVITY)   ____________________________________________  EKG  ED ECG REPORT I, Blake Divine, the attending physician, personally viewed and interpreted this ECG.   Date: 03/01/2019  EKG Time: 8:57  Rate: 78  Rhythm: normal sinus rhythm  Axis: Normal  Intervals:none  ST&T Change: None    PROCEDURES  Procedure(s) performed (including Critical Care):  Procedures   ____________________________________________   INITIAL IMPRESSION / ASSESSMENT AND PLAN / ED COURSE       78 year old female presents to the ED with acute on chronic neck pain over the past 3 days along with an episode of lightheadedness and near syncope earlier this morning.  Patient reports a history of spinal stenosis, but has completely intact neurological exam here today.  Given benign neuro exam and no trauma reported, no indication for advanced imaging.  Symptoms do not appear to be radicular, more likely musculoskeletal, patient may continue previously prescribed hydrocodone and will add muscle relaxer.  Counseled patient to use muscle relaxer at night due to risk of fall.  For her lightheadedness, EKG without evidence of arrhythmia or ischemia.  Low suspicion for cardiac etiology as she has had no chest pain or  shortness of breath.  Will screen labs including troponin.  Lab work unremarkable, doubt cardiac etiology of patient's lightheadedness and near syncope.  UA is negative.  Patient provided with referral to neurosurgery for further evaluation of her chronic neck issues.  Counseled patient to follow-up with her PCP and return  to the ED for new or worsening symptoms, patient agrees with plan.      ____________________________________________   FINAL CLINICAL IMPRESSION(S) / ED DIAGNOSES  Final diagnoses:  Neck pain  Near syncope     ED Discharge Orders         Ordered    cyclobenzaprine (FLEXERIL) 5 MG tablet  3 times daily PRN     03/01/19 1255           Note:  This document was prepared using Dragon voice recognition software and may include unintentional dictation errors.   Blake Divine, MD 03/01/19 [phone]

## 2019-03-09 DIAGNOSIS — M25569 Pain in unspecified knee: Secondary | ICD-10-CM | POA: Insufficient documentation

## 2019-03-09 DIAGNOSIS — M545 Low back pain, unspecified: Secondary | ICD-10-CM | POA: Insufficient documentation

## 2019-03-10 ENCOUNTER — Encounter: Payer: Self-pay | Admitting: Family Medicine

## 2019-03-10 ENCOUNTER — Ambulatory Visit (INDEPENDENT_AMBULATORY_CARE_PROVIDER_SITE_OTHER): Payer: Medicare Other | Admitting: Family Medicine

## 2019-03-10 ENCOUNTER — Other Ambulatory Visit: Payer: Self-pay

## 2019-03-10 VITALS — BP 130/60 | HR 92 | Temp 96.8°F | Resp 16 | Ht 64.0 in | Wt 178.0 lb

## 2019-03-10 DIAGNOSIS — M4722 Other spondylosis with radiculopathy, cervical region: Secondary | ICD-10-CM

## 2019-03-10 DIAGNOSIS — M4726 Other spondylosis with radiculopathy, lumbar region: Secondary | ICD-10-CM | POA: Diagnosis not present

## 2019-03-10 DIAGNOSIS — I10 Essential (primary) hypertension: Secondary | ICD-10-CM | POA: Diagnosis not present

## 2019-03-10 MED ORDER — PREDNISONE 10 MG (21) PO TBPK: ORAL_TABLET | ORAL | 2 refills | Status: DC

## 2019-03-10 NOTE — Patient Instructions (Addendum)
1. Cervical DDD   - predniSONE (STERAPRED UNI-PAK 21 TAB) 10 MG (21) TBPK tablet, 6 day taper. 21 tablets Refills: 2   2. Cervical Stenosis   - predniSONE (STERAPRED UNI-PAK 21 TAB) 10 MG (21) TBPK tablet, 6 day taper. 21 tablets Refills: 2

## 2019-03-10 NOTE — Progress Notes (Signed)
Patient: Michelle Lutz Female    DOB: 20-Jan-1941   78 y.o.   MRN: [id number] Visit Date: 03/10/2019  Today's Provider: Wilhemena Durie, MD   Chief Complaint  Patient presents with  . Hospitalization Follow-up   Subjective:     HPI    Follow up ER visit  Patient was seen in ER for Neck Pain on 03/01/2019. She was treated for Neck Pain and Near Syncope. Treatment for this included; given cyclobenzaprine 5 mg for neck pain. She reports good compliance with treatment. She reports this condition is Improved.  -----------------------------------------------------------  Patient states she has seen improvement seen taking prednisone taper.   Allergies  Allergen Reactions  . Aspirin   . Benazepril Cough  . Zocor [Simvastatin]     Caused pain     Current Outpatient Medications:  .  Biotin 10 MG CAPS, Take 10 mg by mouth daily. , Disp: , Rfl:  .  Calcium Carbonate-Vitamin D3 (CALCIUM 600-D) 600-400 MG-UNIT TABS, Take 1 tablet by mouth 2 (two) times daily., Disp: , Rfl:  .  carboxymethylcellulose (REFRESH PLUS) 0.5 % SOLN, Place 1 drop into both eyes 4 (four) times daily as needed (dry eyes). , Disp: , Rfl:  .  cetirizine (ZYRTEC) 10 MG tablet, Take 10 mg by mouth daily., Disp: , Rfl:  .  Cholecalciferol (VITAMIN D-1000 MAX ST) 25 MCG (1000 UT) tablet, Take 1,000 Units by mouth daily. , Disp: , Rfl:  .  Cranberry 450 MG TABS, Take 450 mg by mouth daily., Disp: , Rfl:  .  cyclobenzaprine (FLEXERIL) 5 MG tablet, Take 1 tablet (5 mg total) by mouth 3 (three) times daily as needed for muscle spasms., Disp: 15 tablet, Rfl: 0 .  desonide (DESOWEN) 0.05 % cream, Apply 1 application topically 2 (two) times daily., Disp: , Rfl:  .  diphenoxylate-atropine (LOMOTIL) 2.5-0.025 MG tablet, Take 1 tablet by mouth 4 (four) times daily as needed for diarrhea or loose stools., Disp: , Rfl:  .  doxazosin (CARDURA) 2 MG tablet, Take 2 mg by mouth daily., Disp: , Rfl:  .  fluticasone  (FLONASE) 50 MCG/ACT nasal spray, Place 2 sprays into both nostrils daily. , Disp: , Rfl:  .  gabapentin (NEURONTIN) 100 MG capsule, Take 100 mg by mouth 3 (three) times daily. , Disp: , Rfl:  .  hydrochlorothiazide (MICROZIDE) 12.5 MG capsule, Take 12.5 mg by mouth daily., Disp: , Rfl:  .  HYDROcodone-acetaminophen (NORCO) 7.5-325 MG tablet, Take 1 tablet by mouth every 6 (six) hours as needed for moderate pain or severe pain. , Disp: , Rfl:  .  irbesartan (AVAPRO) 300 MG tablet, Take 300 mg by mouth daily., Disp: , Rfl:  .  metroNIDAZOLE (METROCREAM) 0.75 % cream, Apply 1 application topically 2 (two) times daily., Disp: , Rfl:  .  montelukast (SINGULAIR) 10 MG tablet, Take 10 mg by mouth at bedtime., Disp: , Rfl:  .  Multiple Vitamin (MULTIVITAMIN) capsule, Take 1 capsule by mouth daily., Disp: , Rfl:  .  omeprazole (PRILOSEC) 20 MG capsule, Take 20 mg by mouth daily., Disp: , Rfl:  .  pravastatin (PRAVACHOL) 40 MG tablet, Take 1 tablet (40 mg total) by mouth daily., Disp: 30 tablet, Rfl: 11 .  Probiotic Product (ALIGN) 4 MG CAPS, Take 4 mg by mouth daily. , Disp: , Rfl:  .  sodium chloride (MURO 128) 5 % ophthalmic ointment, Place 1 application into both eyes as needed for eye irritation. ,  Disp: , Rfl:  .  traMADol (ULTRAM) 50 MG tablet, Take 50 mg by mouth every 6 (six) hours as needed for moderate pain. , Disp: , Rfl:  .  zolpidem (AMBIEN) 5 MG tablet, Take 5 mg by mouth at bedtime as needed for sleep., Disp: , Rfl:  .  methocarbamol (ROBAXIN) 500 MG tablet, Take 1 tablet (500 mg total) by mouth 4 (four) times daily. (Patient not taking: Reported on 02/08/2019), Disp: 30 tablet, Rfl: 1 .  predniSONE (STERAPRED UNI-PAK 21 TAB) 10 MG (21) TBPK tablet, 6-5-4-3-2-1 taper daily by 1 tablet. (Patient not taking: Reported on 03/10/2019), Disp: 21 tablet, Rfl: 1  Review of Systems  Constitutional: Negative.  Negative for appetite change, chills, fatigue and fever.  HENT:       Left ear hearing  loss.  Eyes: Negative.   Respiratory: Negative.  Negative for chest tightness and shortness of breath.   Cardiovascular: Negative.  Negative for chest pain and palpitations.  Gastrointestinal: Negative.  Negative for abdominal pain, nausea and vomiting.  Endocrine: Negative.   Genitourinary: Negative.   Allergic/Immunologic: Negative.   Neurological: Negative.  Negative for dizziness and weakness.  Hematological: Negative.   Psychiatric/Behavioral: Negative.     Social History   Tobacco Use  . Smoking status: Never Smoker  . Smokeless tobacco: Never Used  Substance Use Topics  . Alcohol use: Not Currently      Objective:   BP 130/60 (BP Location: Right Arm, Patient Position: Sitting, Cuff Size: Large)   Pulse 92   Temp (!) 96.8 F (36 C) (Other (Comment))   Resp 16   Ht 5\' 4"  (1.626 m)   Wt 178 lb (80.7 kg)   SpO2 98%   BMI 30.55 kg/m  Vitals:   03/10/19 1339  BP: 130/60  Pulse: 92  Resp: 16  Temp: (!) 96.8 F (36 C)  TempSrc: Other (Comment)  SpO2: 98%  Weight: 178 lb (80.7 kg)  Height: 5\' 4"  (1.626 m)  Body mass index is 30.55 kg/m.   Physical Exam Vitals signs reviewed.  Constitutional:      Appearance: She is obese.  HENT:     Head: Normocephalic and atraumatic.     Right Ear: External ear normal.     Left Ear: External ear normal.  Eyes:     General: No scleral icterus.    Conjunctiva/sclera: Conjunctivae normal.  Cardiovascular:     Rate and Rhythm: Normal rate and regular rhythm.     Heart sounds: Normal heart sounds.  Pulmonary:     Effort: Pulmonary effort is normal.     Breath sounds: Normal breath sounds.  Chest:     Breasts:        Right: Normal.        Left: Normal.  Abdominal:     Palpations: Abdomen is soft.  Lymphadenopathy:     Cervical: No cervical adenopathy.  Skin:    General: Skin is warm and dry.  Neurological:     General: No focal deficit present.     Mental Status: She is alert and oriented to person, place, and  time.  Psychiatric:        Mood and Affect: Mood normal.        Behavior: Behavior normal.        Thought Content: Thought content normal.        Judgment: Judgment normal.      No results found for any visits on 03/10/19.     Assessment &  Plan    1. Osteoarthritis of spine with radiculopathy, lumbar region   2. Benign essential hypertension   3. Osteoarthritis of spine with radiculopathy, cervical region Refer to Physiatry.Much improved after recent flare.      Cranford Mon, MD  [facility]

## 2019-03-17 ENCOUNTER — Other Ambulatory Visit: Payer: Self-pay

## 2019-03-17 MED ORDER — CYCLOBENZAPRINE HCL 5 MG PO TABS: 5.0000 mg | ORAL_TABLET | Freq: Three times a day (TID) | ORAL | 5 refills | Status: DC | PRN

## 2019-03-17 NOTE — Telephone Encounter (Signed)
Patient's husband was seen in the office today, and reports that patient is needing refills. [place] Thanks!

## 2019-03-24 ENCOUNTER — Telehealth: Payer: Self-pay

## 2019-03-24 DIAGNOSIS — M4800 Spinal stenosis, site unspecified: Secondary | ICD-10-CM

## 2019-03-24 NOTE — Telephone Encounter (Signed)
Patient's son requesting a referral to the spine specialist with Dr. Blanche East [facility]

## 2019-03-25 NOTE — Telephone Encounter (Signed)
ok 

## 2019-04-19 ENCOUNTER — Ambulatory Visit: Payer: Medicare Other | Admitting: Student in an Organized Health Care Education/Training Program

## 2019-04-20 ENCOUNTER — Other Ambulatory Visit: Payer: Self-pay

## 2019-04-20 DIAGNOSIS — Z20822 Contact with and (suspected) exposure to covid-19: Secondary | ICD-10-CM

## 2019-04-21 LAB — NOVEL CORONAVIRUS, NAA: SARS-CoV-2, NAA: NOT DETECTED

## 2019-05-30 ENCOUNTER — Other Ambulatory Visit: Payer: Self-pay | Admitting: Family Medicine

## 2019-05-30 NOTE — Telephone Encounter (Signed)
Medication: gabapentin (NEURONTIN) 100 MG capsule [id number]   Has the patient contacted their pharmacy? Yes  (Agent: If no, request that the patient contact the pharmacy for the refill.) (Agent: If yes, when and what did the pharmacy advise?)  Preferred Pharmacy (with phone number or street name): [place], [place]  Phone:  [phone] Fax:  [phone]     Agent: Please be advised that RX refills may take up to 3 business days. We ask that you follow-up with your pharmacy.

## 2019-06-01 MED ORDER — GABAPENTIN 100 MG PO CAPS: 100.0000 mg | ORAL_CAPSULE | Freq: Three times a day (TID) | ORAL | 3 refills | Status: DC

## 2019-07-11 ENCOUNTER — Encounter: Payer: Self-pay | Admitting: Physician Assistant

## 2019-07-11 ENCOUNTER — Ambulatory Visit (INDEPENDENT_AMBULATORY_CARE_PROVIDER_SITE_OTHER): Payer: Medicare Other | Admitting: Physician Assistant

## 2019-07-11 VITALS — BP 138/77 | HR 75 | Wt 175.0 lb

## 2019-07-11 DIAGNOSIS — N3000 Acute cystitis without hematuria: Secondary | ICD-10-CM | POA: Diagnosis not present

## 2019-07-11 MED ORDER — SULFAMETHOXAZOLE-TRIMETHOPRIM 800-160 MG PO TABS: 1.0000 | ORAL_TABLET | Freq: Two times a day (BID) | ORAL | 0 refills | Status: DC

## 2019-07-11 NOTE — Progress Notes (Signed)
Patient: Michelle Lutz Female    DOB: [birth year]   79 y.o.   MRN: [id number] Visit Date: 07/11/2019  Today's Provider: Mar Daring, PA-C   Chief Complaint  Patient presents with  . Dysuria   Subjective:     Virtual Visit via Video Note  I connected with Michelle Lutz on 07/11/19 at 11:00 AM EST by a video enabled telemedicine application and verified that I am speaking with the correct person using two identifiers.  Location: Patient: Home Provider: [facility]   I discussed the limitations of evaluation and management by telemedicine and the availability of in person appointments. The patient expressed understanding and agreed to proceed.   Dysuria  This is a new problem. The current episode started in the past 7 days (Saturday). The problem occurs every urination. The problem has been gradually worsening. The quality of the pain is described as burning ("pressure"). The pain is mild. There has been no fever. Associated symptoms include frequency and urgency. Pertinent negatives include no chills, discharge, flank pain, hematuria or nausea. She has tried nothing for the symptoms. The treatment provided no relief.    Allergies  Allergen Reactions  . Aspirin   . Benazepril Cough  . Zocor [Simvastatin]     Caused pain     Current Outpatient Medications:  .  Biotin 10 MG CAPS, Take 10 mg by mouth daily. , Disp: , Rfl:  .  Calcium Carbonate-Vitamin D3 (CALCIUM 600-D) 600-400 MG-UNIT TABS, Take 1 tablet by mouth 2 (two) times daily., Disp: , Rfl:  .  carboxymethylcellulose (REFRESH PLUS) 0.5 % SOLN, Place 1 drop into both eyes 4 (four) times daily as needed (dry eyes). , Disp: , Rfl:  .  cetirizine (ZYRTEC) 10 MG tablet, Take 10 mg by mouth daily., Disp: , Rfl:  .  Cholecalciferol (VITAMIN D-1000 MAX ST) 25 MCG (1000 UT) tablet, Take 1,000 Units by mouth daily. , Disp: , Rfl:  .  Cranberry 450 MG TABS, Take 450 mg by mouth daily., Disp: , Rfl:  .  cyclobenzaprine  (FLEXERIL) 5 MG tablet, Take 1 tablet (5 mg total) by mouth 3 (three) times daily as needed for muscle spasms., Disp: 30 tablet, Rfl: 5 .  desonide (DESOWEN) 0.05 % cream, Apply 1 application topically 2 (two) times daily., Disp: , Rfl:  .  diphenoxylate-atropine (LOMOTIL) 2.5-0.025 MG tablet, Take 1 tablet by mouth 4 (four) times daily as needed for diarrhea or loose stools., Disp: , Rfl:  .  doxazosin (CARDURA) 2 MG tablet, Take 2 mg by mouth daily., Disp: , Rfl:  .  fluticasone (FLONASE) 50 MCG/ACT nasal spray, Place 2 sprays into both nostrils daily. , Disp: , Rfl:  .  gabapentin (NEURONTIN) 100 MG capsule, Take 1 capsule (100 mg total) by mouth 3 (three) times daily., Disp: 90 capsule, Rfl: 3 .  hydrochlorothiazide (MICROZIDE) 12.5 MG capsule, Take 12.5 mg by mouth daily., Disp: , Rfl:  .  HYDROcodone-acetaminophen (NORCO) 7.5-325 MG tablet, Take 1 tablet by mouth every 6 (six) hours as needed for moderate pain or severe pain. , Disp: , Rfl:  .  irbesartan (AVAPRO) 300 MG tablet, Take 300 mg by mouth daily., Disp: , Rfl:  .  metroNIDAZOLE (METROCREAM) 0.75 % cream, Apply 1 application topically 2 (two) times daily., Disp: , Rfl:  .  montelukast (SINGULAIR) 10 MG tablet, Take 10 mg by mouth at bedtime., Disp: , Rfl:  .  Multiple Vitamin (MULTIVITAMIN) capsule, Take 1  capsule by mouth daily., Disp: , Rfl:  .  omeprazole (PRILOSEC) 20 MG capsule, Take 20 mg by mouth daily., Disp: , Rfl:  .  pravastatin (PRAVACHOL) 40 MG tablet, Take 1 tablet (40 mg total) by mouth daily., Disp: 30 tablet, Rfl: 11 .  Probiotic Product (ALIGN) 4 MG CAPS, Take 4 mg by mouth daily. , Disp: , Rfl:  .  sodium chloride (MURO 128) 5 % ophthalmic ointment, Place 1 application into both eyes as needed for eye irritation. , Disp: , Rfl:  .  traMADol (ULTRAM) 50 MG tablet, Take 50 mg by mouth every 6 (six) hours as needed for moderate pain. , Disp: , Rfl:  .  methocarbamol (ROBAXIN) 500 MG tablet, Take 1 tablet (500 mg total)  by mouth 4 (four) times daily. (Patient not taking: Reported on 02/08/2019), Disp: 30 tablet, Rfl: 1 .  predniSONE (STERAPRED UNI-PAK 21 TAB) 10 MG (21) TBPK tablet, Take by mouth 6 tablets day 1, 5 tablets day 2, 4 tablets day 3, 3 tablets day 4, 2 tablets day 5, 1 tablet day 6 (Patient not taking: Reported on 07/11/2019), Disp: 21 tablet, Rfl: 2 .  zolpidem (AMBIEN) 5 MG tablet, Take 5 mg by mouth at bedtime as needed for sleep., Disp: , Rfl:   Review of Systems  Constitutional: Negative for chills and fever.  Respiratory: Negative.   Cardiovascular: Negative.   Gastrointestinal: Negative for nausea.  Genitourinary: Positive for dysuria, frequency and urgency. Negative for flank pain, hematuria, menstrual problem, pelvic pain, vaginal bleeding, vaginal discharge and vaginal pain.    Social History   Tobacco Use  . Smoking status: Never Smoker  . Smokeless tobacco: Never Used  Substance Use Topics  . Alcohol use: Not Currently      Objective:   BP 138/77 (BP Location: Left Wrist, Patient Position: Sitting, Cuff Size: Large) Comment: 110/65 At 10 this am  Pulse 75   Wt 175 lb (79.4 kg)   BMI 30.04 kg/m  Vitals:   07/11/19 1038  BP: 138/77  Pulse: 75  Weight: 175 lb (79.4 kg)  Body mass index is 30.04 kg/m.   Physical Exam Vitals reviewed.  Pulmonary:     Effort: Pulmonary effort is normal. No respiratory distress.  Abdominal:     Comments: Reports suprapubic pressure  Neurological:     Mental Status: She is alert.     No results found for any visits on 07/11/19.     Assessment & Plan     1. Acute cystitis without hematuria Worsening symptoms. Will treat empirically with Bactrim as below.  Continue to push fluids. She is to call if symptoms do not improve or if they worsen.  - sulfamethoxazole-trimethoprim (BACTRIM DS) 800-160 MG tablet; Take 1 tablet by mouth 2 (two) times daily.  Dispense: 10 tablet; Refill: 0   I discussed the assessment and treatment plan  with the patient. The patient was provided an opportunity to ask questions and all were answered. The patient agreed with the plan and demonstrated an understanding of the instructions.   The patient was advised to call back or seek an in-person evaluation if the symptoms worsen or if the condition fails to improve as anticipated.  I provided 7 minutes of non-face-to-face time during this encounter.    Mar Daring, PA-C  [facility]

## 2019-07-11 NOTE — Patient Instructions (Signed)
Urinary Tract Infection, Adult A urinary tract infection (UTI) is an infection of any part of the urinary tract. The urinary tract includes:  The kidneys.  The ureters.  The bladder.  The urethra. These organs make, store, and get rid of pee (urine) in the body. What are the causes? This is caused by germs (bacteria) in your genital area. These germs grow and cause swelling (inflammation) of your urinary tract. What increases the risk? You are more likely to develop this condition if:  You have a small, thin tube (catheter) to drain pee.  You cannot control when you pee or poop (incontinence).  You are female, and: ? You use these methods to prevent pregnancy:  A medicine that kills sperm (spermicide).  A device that blocks sperm (diaphragm). ? You have low levels of a female hormone (estrogen). ? You are pregnant.  You have genes that add to your risk.  You are sexually active.  You take antibiotic medicines.  You have trouble peeing because of: ? A prostate that is bigger than normal, if you are female. ? A blockage in the part of your body that drains pee from the bladder (urethra). ? A kidney stone. ? A nerve condition that affects your bladder (neurogenic bladder). ? Not getting enough to drink. ? Not peeing often enough.  You have other conditions, such as: ? Diabetes. ? A weak disease-fighting system (immune system). ? Sickle cell disease. ? Gout. ? Injury of the spine. What are the signs or symptoms? Symptoms of this condition include:  Needing to pee right away (urgently).  Peeing often.  Peeing small amounts often.  Pain or burning when peeing.  Blood in the pee.  Pee that smells bad or not like normal.  Trouble peeing.  Pee that is cloudy.  Fluid coming from the vagina, if you are female.  Pain in the belly or lower back. Other symptoms include:  Throwing up (vomiting).  No urge to eat.  Feeling mixed up (confused).  Being tired  and grouchy (irritable).  A fever.  Watery poop (diarrhea). How is this treated? This condition may be treated with:  Antibiotic medicine.  Other medicines.  Drinking enough water. Follow these instructions at home:  Medicines  Take over-the-counter and prescription medicines only as told by your doctor.  If you were prescribed an antibiotic medicine, take it as told by your doctor. Do not stop taking it even if you start to feel better. General instructions  Make sure you: ? Pee until your bladder is empty. ? Do not hold pee for a long time. ? Empty your bladder after sex. ? Wipe from front to back after pooping if you are a female. Use each tissue one time when you wipe.  Drink enough fluid to keep your pee pale yellow.  Keep all follow-up visits as told by your doctor. This is important. Contact a doctor if:  You do not get better after 1-2 days.  Your symptoms go away and then come back. Get help right away if:  You have very bad back pain.  You have very bad pain in your lower belly.  You have a fever.  You are sick to your stomach (nauseous).  You are throwing up. Summary  A urinary tract infection (UTI) is an infection of any part of the urinary tract.  This condition is caused by germs in your genital area.  There are many risk factors for a UTI. These include having a small, thin   tube to drain pee and not being able to control when you pee or poop.  Treatment includes antibiotic medicines for germs.  Drink enough fluid to keep your pee pale yellow. This information is not intended to replace advice given to you by your health care provider. Make sure you discuss any questions you have with your health care provider. Document Revised: 04/29/2018 Document Reviewed: 11/19/2017 Elsevier Patient Education  2020 Elsevier Inc.  

## 2019-08-08 NOTE — Progress Notes (Signed)
------------------------------------      Patient: Michelle Lutz Female    DOB: 1940/10/02   79 y.o.   MRN: [id number] Visit Date: 08/09/2019  Today's Provider: Wilhemena Durie, MD   Chief Complaint  Patient presents with  . Follow-up  . Hypertension  . Hyperlipidemia   Subjective:     HPI  Patient is feeling well.  She has no complaints.  Blood pressure and pulse rates brought from home which are checked daily.  She has only 1 day of a systolic below [id number].  The rest of the time they are 1 99991111 diastolics are always Q000111Q or 80s. She has about a BMD as she had osteopenia on previous.  Last one was September 2019. Essential hypertension From 02/08/2019-labs checked showing-okay.  Patient's home blood pressures averaging 137/75.  Hyperlipidemia, unspecified hyperlipidemia type From 02/08/2019-labs checked showing-okay.  Obesity From 02/08/2019-With HTN. Labs checked showing-okay.  Allergies  Allergen Reactions  . Aspirin   . Benazepril Cough  . Zocor [Simvastatin]     Caused pain     Current Outpatient Medications:  .  Biotin 10 MG CAPS, Take 10 mg by mouth daily. , Disp: , Rfl:  .  Calcium Carbonate-Vitamin D3 (CALCIUM 600-D) 600-400 MG-UNIT TABS, Take 1 tablet by mouth 2 (two) times daily., Disp: , Rfl:  .  carboxymethylcellulose (REFRESH PLUS) 0.5 % SOLN, Place 1 drop into both eyes 4 (four) times daily as needed (dry eyes). , Disp: , Rfl:  .  cetirizine (ZYRTEC) 10 MG tablet, Take 10 mg by mouth daily., Disp: , Rfl:  .  Cholecalciferol (VITAMIN D-1000 MAX ST) 25 MCG (1000 UT) tablet, Take 1,000 Units by mouth daily. , Disp: , Rfl:  .  Cranberry 450 MG TABS, Take 450 mg by mouth daily., Disp: , Rfl:  .  desonide (DESOWEN) 0.05 % cream, Apply 1 application topically 2 (two) times daily., Disp: , Rfl:  .  diphenoxylate-atropine (LOMOTIL) 2.5-0.025 MG tablet, Take 1 tablet by mouth 4 (four) times daily as needed for diarrhea or loose stools.,  Disp: , Rfl:  .  doxazosin (CARDURA) 2 MG tablet, Take 2 mg by mouth daily., Disp: , Rfl:  .  fluticasone (FLONASE) 50 MCG/ACT nasal spray, Place 2 sprays into both nostrils daily. , Disp: , Rfl:  .  gabapentin (NEURONTIN) 100 MG capsule, Take 1 capsule (100 mg total) by mouth 3 (three) times daily., Disp: 90 capsule, Rfl: 3 .  hydrochlorothiazide (MICROZIDE) 12.5 MG capsule, Take 1 capsule (12.5 mg total) by mouth daily., Disp: 90 capsule, Rfl: 3 .  HYDROcodone-acetaminophen (NORCO) 7.5-325 MG tablet, Take 1 tablet by mouth every 6 (six) hours as needed for moderate pain or severe pain. , Disp: , Rfl:  .  irbesartan (AVAPRO) 300 MG tablet, Take 300 mg by mouth daily., Disp: , Rfl:  .  metroNIDAZOLE (METROCREAM) 0.75 % cream, Apply 1 application topically 2 (two) times daily., Disp: , Rfl:  .  montelukast (SINGULAIR) 10 MG tablet, Take 1 tablet (10 mg total) by mouth at bedtime., Disp: 90 tablet, Rfl: 3 .  Multiple Vitamin (MULTIVITAMIN) capsule, Take 1 capsule by mouth daily., Disp: , Rfl:  .  omeprazole (PRILOSEC) 20 MG capsule, Take 20 mg by mouth daily., Disp: , Rfl:  .  pravastatin (PRAVACHOL) 40 MG tablet, Take 1 tablet (40 mg total) by mouth daily., Disp: 30 tablet, Rfl: 11 .  Probiotic Product (ALIGN) 4 MG CAPS, Take  4 mg by mouth daily. , Disp: , Rfl:  .  sodium chloride (MURO 128) 5 % ophthalmic ointment, Place 1 application into both eyes as needed for eye irritation. , Disp: , Rfl:  .  traMADol (ULTRAM) 50 MG tablet, Take 50 mg by mouth every 6 (six) hours as needed for moderate pain. , Disp: , Rfl:  .  zolpidem (AMBIEN) 5 MG tablet, Take 5 mg by mouth at bedtime as needed for sleep., Disp: , Rfl:  .  cyclobenzaprine (FLEXERIL) 5 MG tablet, Take 1 tablet (5 mg total) by mouth 3 (three) times daily as needed for muscle spasms. (Patient not taking: Reported on 08/09/2019), Disp: 30 tablet, Rfl: 5 .  methocarbamol (ROBAXIN) 500 MG tablet, Take 1 tablet (500 mg total) by mouth 4 (four) times  daily. (Patient not taking: Reported on 02/08/2019), Disp: 30 tablet, Rfl: 1 .  predniSONE (STERAPRED UNI-PAK 21 TAB) 10 MG (21) TBPK tablet, Take by mouth 6 tablets day 1, 5 tablets day 2, 4 tablets day 3, 3 tablets day 4, 2 tablets day 5, 1 tablet day 6 (Patient not taking: Reported on 07/11/2019), Disp: 21 tablet, Rfl: 2 .  sulfamethoxazole-trimethoprim (BACTRIM DS) 800-160 MG tablet, Take 1 tablet by mouth 2 (two) times daily. (Patient not taking: Reported on 08/09/2019), Disp: 10 tablet, Rfl: 0  Review of Systems  Constitutional: Negative for appetite change, chills, fatigue and fever.  HENT: Negative.   Eyes: Negative.   Respiratory: Negative for chest tightness and shortness of breath.   Cardiovascular: Negative for chest pain and palpitations.  Gastrointestinal: Negative for abdominal pain, nausea and vomiting.  Endocrine: Negative.   Skin: Negative.   Allergic/Immunologic: Negative.   Neurological: Negative for dizziness and weakness.  Hematological: Negative.   Psychiatric/Behavioral: Negative.     Social History   Tobacco Use  . Smoking status: Never Smoker  . Smokeless tobacco: Never Used  Substance Use Topics  . Alcohol use: Not Currently      Objective:   BP (!) 157/81 (BP Location: Right Arm, Patient Position: Sitting, Cuff Size: Large)   Pulse 69   Temp (!) 97.3 F (36.3 C) (Other (Comment))   Resp 18   Ht 5\' 4"  (1.626 m)   Wt 182 lb (82.6 kg)   SpO2 97%   BMI 31.24 kg/m  Vitals:   08/09/19 1027  BP: (!) 157/81  Pulse: 69  Resp: 18  Temp: (!) 97.3 F (36.3 C)  TempSrc: Other (Comment)  SpO2: 97%  Weight: 182 lb (82.6 kg)  Height: 5\' 4"  (1.626 m)  Body mass index is 31.24 kg/m.   Physical Exam Vitals reviewed.  Constitutional:      Appearance: She is obese.  HENT:     Head: Normocephalic and atraumatic.     Right Ear: External ear normal.     Left Ear: External ear normal.  Eyes:     General: No scleral icterus.    Conjunctiva/sclera:  Conjunctivae normal.  Cardiovascular:     Rate and Rhythm: Normal rate and regular rhythm.     Heart sounds: Normal heart sounds.  Pulmonary:     Effort: Pulmonary effort is normal.     Breath sounds: Normal breath sounds.  Chest:     Breasts:        Right: Normal.        Left: Normal.  Abdominal:     Palpations: Abdomen is soft.  Lymphadenopathy:     Cervical: No cervical adenopathy.  Skin:  General: Skin is warm and dry.  Neurological:     General: No focal deficit present.     Mental Status: She is alert and oriented to person, place, and time.  Psychiatric:        Mood and Affect: Mood normal.        Behavior: Behavior normal.        Thought Content: Thought content normal.        Judgment: Judgment normal.      No results found for any visits on 08/09/19.     Assessment & Plan    1. Chronic rhinitis  - montelukast (SINGULAIR) 10 MG tablet; Take 1 tablet (10 mg total) by mouth at bedtime.  Dispense: 90 tablet; Refill: 3  2. Essential hypertension Good control.  Follow-up in 6 months. - hydrochlorothiazide (MICROZIDE) 12.5 MG capsule; Take 1 capsule (12.5 mg total) by mouth daily.  Dispense: 90 capsule; Refill: 3  3. Osteoarthritis of spine with radiculopathy, lumbar region   4. Osteopenia, unspecified location BMD after September 2021    Follow up in 6 months.     I,Ranon Coven,acting as a scribe for Wilhemena Durie, MD.,have documented all relevant documentation on the behalf of Wilhemena Durie, MD,as directed by  Wilhemena Durie, MD while in the presence of Wilhemena Durie, MD.     Wilhemena Durie, MD  [facility]

## 2019-08-09 ENCOUNTER — Encounter: Payer: Self-pay | Admitting: Family Medicine

## 2019-08-09 ENCOUNTER — Ambulatory Visit (INDEPENDENT_AMBULATORY_CARE_PROVIDER_SITE_OTHER): Payer: Medicare Other | Admitting: Family Medicine

## 2019-08-09 ENCOUNTER — Other Ambulatory Visit: Payer: Self-pay

## 2019-08-09 VITALS — BP 157/81 | HR 69 | Temp 97.3°F | Resp 18 | Ht 64.0 in | Wt 182.0 lb

## 2019-08-09 DIAGNOSIS — I10 Essential (primary) hypertension: Secondary | ICD-10-CM

## 2019-08-09 DIAGNOSIS — M4726 Other spondylosis with radiculopathy, lumbar region: Secondary | ICD-10-CM

## 2019-08-09 DIAGNOSIS — M8580 Other specified disorders of bone density and structure, unspecified site: Secondary | ICD-10-CM

## 2019-08-09 DIAGNOSIS — J310 Chronic rhinitis: Secondary | ICD-10-CM | POA: Diagnosis not present

## 2019-08-09 MED ORDER — MONTELUKAST SODIUM 10 MG PO TABS: 10.0000 mg | ORAL_TABLET | Freq: Every day | ORAL | 3 refills | Status: DC

## 2019-08-09 MED ORDER — HYDROCHLOROTHIAZIDE 12.5 MG PO CAPS: 12.5000 mg | ORAL_CAPSULE | Freq: Every day | ORAL | 3 refills | Status: DC

## 2019-08-22 ENCOUNTER — Encounter: Payer: Self-pay | Admitting: Family Medicine

## 2019-08-22 ENCOUNTER — Telehealth: Payer: Self-pay

## 2019-10-18 ENCOUNTER — Telehealth: Payer: Self-pay | Admitting: Family Medicine

## 2019-10-18 DIAGNOSIS — J310 Chronic rhinitis: Secondary | ICD-10-CM

## 2019-10-18 MED ORDER — MONTELUKAST SODIUM 10 MG PO TABS: 10.0000 mg | ORAL_TABLET | Freq: Every day | ORAL | 3 refills | Status: DC

## 2019-10-18 NOTE — Telephone Encounter (Signed)
Refill sent to pharmacy.   

## 2019-10-18 NOTE — Telephone Encounter (Signed)
RX REFILL montelukast (SINGULAIR) 10 MG tablet [id number]  PH[facility]ACY [facility] [place], [place] Phone:  [phone]  Fax:  [phone]

## 2019-11-14 ENCOUNTER — Other Ambulatory Visit: Payer: Self-pay | Admitting: Family Medicine

## 2019-11-14 DIAGNOSIS — I10 Essential (primary) hypertension: Secondary | ICD-10-CM

## 2019-11-14 MED ORDER — DOXAZOSIN MESYLATE 2 MG PO TABS: 2.0000 mg | ORAL_TABLET | Freq: Every day | ORAL | 1 refills | Status: DC

## 2019-11-14 NOTE — Telephone Encounter (Signed)
Requested medication (s) are due for refill today: Quantity not specified  Requested medication (s) are on the active medication list: Yes  Last refill:02/08/2019  Future visit scheduled    yes 02/14/2020  Notes to clinic  Historical provider.. [facility] requesting refill ofTramadol, historical provider, [facility]  10/05/2019  Quantity not specified  Requested Prescriptions  Pending Prescriptions Disp Refills   doxazosin (CARDURA) 2 MG tablet      Sig: Take 1 tablet (2 mg total) by mouth daily.      Cardiovascular:  Alpha Blockers Failed - 11/14/2019  9:28 AM      Failed - Last BP in normal range    BP Readings from Last 1 Encounters:  08/09/19 (!) 157/81          Passed - Valid encounter within last 6 months    Recent Outpatient Visits           3 months ago Chronic rhinitis   [facility] Jerrol Banana., MD   4 months ago Acute cystitis without hematuria   [place], [place]   8 months ago Osteoarthritis of spine with radiculopathy, lumbar region   [facility] Jerrol Banana., MD   9 months ago Annual physical exam   [facility] Jerrol Banana., MD   1 year ago Back strain, initial encounter   [place], [place], [place]

## 2019-11-14 NOTE — Telephone Encounter (Signed)
Medication Refill - Medication: Doxazosin, Tramadol  Has the patient contacted their pharmacy? Yes.   (Agent: If no, request that the patient contact the pharmacy for the refill.) (Agent: If yes, when and what did the pharmacy advise?)  Preferred Pharmacy (with phone number or street name): [place], [place]  Agent: Please be advised that RX refills may take up to 3 business days. We ask that you follow-up with your pharmacy.

## 2019-11-29 ENCOUNTER — Other Ambulatory Visit: Payer: Self-pay | Admitting: Family Medicine

## 2019-11-29 MED ORDER — GABAPENTIN 100 MG PO CAPS: 100.0000 mg | ORAL_CAPSULE | Freq: Three times a day (TID) | ORAL | 0 refills | Status: DC

## 2019-11-29 NOTE — Telephone Encounter (Signed)
Medication Refill - Medication: Gabapentin   Has the patient contacted their pharmacy? Yes.   Pt called and is requesting to have a short supply of this medication until her appt on 12/12/19. Please advise.  (Agent: If no, request that the patient contact the pharmacy for the refill.) (Agent: If yes, when and what did the pharmacy advise?)  Preferred Pharmacy (with phone number or street name):  [place], [place] - [place]  [place] [address]  Phone: [phone] Fax: [phone]  Hours: Not open 24 hours    Agent: Please be advised that RX refills may take up to 3 business days. We ask that you follow-up with your pharmacy.

## 2019-12-09 NOTE — Progress Notes (Signed)
Established patient visit  I,Michelle Lutz,acting as a scribe for Wilhemena Durie, MD.,have documented all relevant documentation on the behalf of Wilhemena Durie, MD,as directed by  Wilhemena Durie, MD while in the presence of Wilhemena Durie, MD.   Patient: Michelle Lutz   DOB: Apr 27, 1941   79 y.o. Female  MRN: [id number] Visit Date: 12/12/2019  Today's healthcare provider: Wilhemena Durie, MD   No chief complaint on file.  Subjective    HPI  Patient is here for medication refills. Overall she is feeling well. She has chronic low back pain that flares up at times. She states tramadol is helped this in the past and takes it very infrequently.      Medications: Outpatient Medications Prior to Visit  Medication Sig  . Biotin 10 MG CAPS Take 10 mg by mouth daily.   . Calcium Carbonate-Vitamin D3 (CALCIUM 600-D) 600-400 MG-UNIT TABS Take 1 tablet by mouth 2 (two) times daily.  . carboxymethylcellulose (REFRESH PLUS) 0.5 % SOLN Place 1 drop into both eyes 4 (four) times daily as needed (dry eyes).   . cetirizine (ZYRTEC) 10 MG tablet Take 10 mg by mouth daily.  . Cholecalciferol (VITAMIN D-1000 MAX ST) 25 MCG (1000 UT) tablet Take 1,000 Units by mouth daily.   . Cranberry 450 MG TABS Take 450 mg by mouth daily.  Marland Kitchen desonide (DESOWEN) 0.05 % cream Apply 1 application topically 2 (two) times daily.  . diphenoxylate-atropine (LOMOTIL) 2.5-0.025 MG tablet Take 1 tablet by mouth 4 (four) times daily as needed for diarrhea or loose stools.  . doxazosin (CARDURA) 2 MG tablet Take 1 tablet (2 mg total) by mouth daily.  . hydrochlorothiazide (MICROZIDE) 12.5 MG capsule Take 1 capsule (12.5 mg total) by mouth daily.  Marland Kitchen HYDROcodone-acetaminophen (NORCO) 7.5-325 MG tablet Take 1 tablet by mouth every 6 (six) hours as needed for moderate pain or severe pain.   . metroNIDAZOLE (METROCREAM) 0.75 % cream Apply 1 application topically 2 (two) times daily.  . montelukast (SINGULAIR) 10  MG tablet Take 1 tablet (10 mg total) by mouth at bedtime.  . Multiple Vitamin (MULTIVITAMIN) capsule Take 1 capsule by mouth daily.  Marland Kitchen omeprazole (PRILOSEC) 20 MG capsule Take 20 mg by mouth daily.  . Probiotic Product (ALIGN) 4 MG CAPS Take 4 mg by mouth daily.   . sodium chloride (MURO 128) 5 % ophthalmic ointment Place 1 application into both eyes as needed for eye irritation.   . traMADol (ULTRAM) 50 MG tablet Take 50 mg by mouth every 6 (six) hours as needed for moderate pain.   Marland Kitchen zolpidem (AMBIEN) 5 MG tablet Take 5 mg by mouth at bedtime as needed for sleep.  . [DISCONTINUED] fluticasone (FLONASE) 50 MCG/ACT nasal spray Place 2 sprays into both nostrils daily.   . [DISCONTINUED] gabapentin (NEURONTIN) 100 MG capsule Take 1 capsule (100 mg total) by mouth 3 (three) times daily.  . [DISCONTINUED] irbesartan (AVAPRO) 300 MG tablet Take 300 mg by mouth daily.  . [DISCONTINUED] pravastatin (PRAVACHOL) 40 MG tablet Take 1 tablet (40 mg total) by mouth daily.  . cyclobenzaprine (FLEXERIL) 5 MG tablet Take 1 tablet (5 mg total) by mouth 3 (three) times daily as needed for muscle spasms. (Patient not taking: Reported on 08/09/2019)  . methocarbamol (ROBAXIN) 500 MG tablet Take 1 tablet (500 mg total) by mouth 4 (four) times daily. (Patient not taking: Reported on 02/08/2019)  . predniSONE (STERAPRED UNI-PAK 21 TAB) 10 MG (21)  TBPK tablet Take by mouth 6 tablets day 1, 5 tablets day 2, 4 tablets day 3, 3 tablets day 4, 2 tablets day 5, 1 tablet day 6 (Patient not taking: Reported on 07/11/2019)  . sulfamethoxazole-trimethoprim (BACTRIM DS) 800-160 MG tablet Take 1 tablet by mouth 2 (two) times daily. (Patient not taking: Reported on 08/09/2019)   No facility-administered medications prior to visit.    Review of Systems  Constitutional: Negative for appetite change, chills, fatigue and fever.  Respiratory: Negative for chest tightness and shortness of breath.   Cardiovascular: Negative for chest pain  and palpitations.  Gastrointestinal: Negative for abdominal pain, nausea and vomiting.  Neurological: Negative for dizziness and weakness.       Objective    BP (!) 150/81 (BP Location: Right Arm, Patient Position: Sitting, Cuff Size: Large)   Pulse 67   Temp (!) 97.3 F (36.3 C) (Other (Comment))   Resp 18   Ht 5\' 4"  (1.626 m)   Wt 181 lb (82.1 kg)   SpO2 98%   BMI 31.07 kg/m  BP Readings from Last 3 Encounters:  12/12/19 (!) 150/81  08/09/19 (!) 157/81  07/11/19 138/77   Wt Readings from Last 3 Encounters:  12/12/19 181 lb (82.1 kg)  08/09/19 182 lb (82.6 kg)  07/11/19 175 lb (79.4 kg)      Physical Exam  BP (!) 150/81 (BP Location: Right Arm, Patient Position: Sitting, Cuff Size: Large)   Pulse 67   Temp (!) 97.3 F (36.3 C) (Other (Comment))   Resp 18   Ht 5\' 4"  (1.626 m)   Wt 181 lb (82.1 kg)   SpO2 98%   BMI 31.07 kg/m   General Appearance:    Alert, cooperative, no distress, appears stated age  Head:    Normocephalic, without obvious abnormality, atraumatic  Eyes:    PERRL, conjunctiva/corneas clear, EOM's intact, fundi    benign, both eyes  Ears:    Normal TM's and external ear canals, both ears  Nose:   Nares normal, septum midline, mucosa normal, no drainage    or sinus tenderness  Throat:   Lips, mucosa, and tongue normal; teeth and gums normal  Neck:   Supple, symmetrical, trachea midline, no adenopathy;    thyroid:  no enlargement/tenderness/nodules; no carotid   bruit or JVD  Back:     Symmetric, no curvature, ROM normal, no CVA tenderness  Lungs:     Clear to auscultation bilaterally, respirations unlabored  Chest Wall:    No tenderness or deformity   Heart:    Regular rate and rhythm, S1 and S2 normal, no murmur, rub   or gallop  Breast Exam:    No tenderness, masses, or nipple abnormality  Abdomen:     Soft, non-tender, bowel sounds active all four quadrants,    no masses, no organomegaly  Genitalia:    Normal female without lesion,  discharge or tenderness  Rectal:    Normal tone, normal prostate, no masses or tenderness;   guaiac negative stool  Extremities:   Extremities normal, atraumatic, no cyanosis or edema  Pulses:   2+ and symmetric all extremities  Skin:   Skin color, texture, turgor normal, no rashes or lesions  Lymph nodes:   Cervical, supraclavicular, and axillary nodes normal  Neurologic:   CNII-XII intact, normal strength, sensation and reflexes    throughout     No results found for any visits on 12/12/19.  Assessment & Plan     1. Essential hypertension  -  CBC w/Diff/Platelet - Comprehensive Metabolic Panel (CMET) - Lipid panel - TSH  2. Hyperlipidemia, unspecified hyperlipidemia type  - CBC w/Diff/Platelet - Comprehensive Metabolic Panel (CMET) - Lipid panel - TSH  3. Chronic low back pain, unspecified back pain laterality, unspecified whether sciatica present Refill tramadol at patient request. PDMP checked. - CBC w/Diff/Platelet - Comprehensive Metabolic Panel (CMET) - Lipid panel - TSH   No follow-ups on file.      I, Wilhemena Durie, MD, have reviewed all documentation for this visit. The documentation on 12/15/19 for the exam, diagnosis, procedures, and orders are all accurate and complete.    Skylor Schnapp Cranford Mon, MD  [facility] [phone] (phone) [phone] (fax)  [place]

## 2019-12-12 ENCOUNTER — Ambulatory Visit (INDEPENDENT_AMBULATORY_CARE_PROVIDER_SITE_OTHER): Payer: Medicare Other | Admitting: Family Medicine

## 2019-12-12 ENCOUNTER — Other Ambulatory Visit: Payer: Self-pay

## 2019-12-12 ENCOUNTER — Encounter: Payer: Self-pay | Admitting: Family Medicine

## 2019-12-12 VITALS — BP 150/81 | HR 67 | Temp 97.3°F | Resp 18 | Ht 64.0 in | Wt 181.0 lb

## 2019-12-12 DIAGNOSIS — I10 Essential (primary) hypertension: Secondary | ICD-10-CM | POA: Diagnosis not present

## 2019-12-12 DIAGNOSIS — G8929 Other chronic pain: Secondary | ICD-10-CM

## 2019-12-12 DIAGNOSIS — E785 Hyperlipidemia, unspecified: Secondary | ICD-10-CM

## 2019-12-12 DIAGNOSIS — M545 Low back pain, unspecified: Secondary | ICD-10-CM

## 2019-12-12 MED ORDER — IRBESARTAN 300 MG PO TABS: 300.0000 mg | ORAL_TABLET | Freq: Every day | ORAL | 1 refills | Status: DC

## 2019-12-12 MED ORDER — FLUTICASONE PROPIONATE 50 MCG/ACT NA SUSP: 2.0000 | Freq: Every day | NASAL | 3 refills | Status: DC

## 2019-12-12 MED ORDER — PRAVASTATIN SODIUM 40 MG PO TABS: 40.0000 mg | ORAL_TABLET | Freq: Every day | ORAL | 11 refills | Status: DC

## 2019-12-12 MED ORDER — GABAPENTIN 100 MG PO CAPS: 100.0000 mg | ORAL_CAPSULE | Freq: Three times a day (TID) | ORAL | 5 refills | Status: DC

## 2019-12-13 ENCOUNTER — Other Ambulatory Visit: Payer: Self-pay

## 2019-12-13 ENCOUNTER — Encounter: Payer: Self-pay | Admitting: Family Medicine

## 2019-12-13 DIAGNOSIS — Z780 Asymptomatic menopausal state: Secondary | ICD-10-CM

## 2019-12-13 NOTE — Progress Notes (Signed)
Bone density order placed for patient because she is overdue for it.

## 2019-12-14 ENCOUNTER — Other Ambulatory Visit: Payer: Self-pay | Admitting: Family Medicine

## 2019-12-14 DIAGNOSIS — Z1231 Encounter for screening mammogram for malignant neoplasm of breast: Secondary | ICD-10-CM

## 2019-12-15 LAB — COMPREHENSIVE METABOLIC PANEL
ALT: 14 IU/L (ref 0–32)
AST: 20 IU/L (ref 0–40)
Albumin/Globulin Ratio: 1.8 (ref 1.2–2.2)
Albumin: 4.2 g/dL (ref 3.7–4.7)
Alkaline Phosphatase: 78 IU/L (ref 48–121)
BUN/Creatinine Ratio: 14 (ref 12–28)
BUN: 13 mg/dL (ref 8–27)
Bilirubin Total: 0.4 mg/dL (ref 0.0–1.2)
CO2: 24 mmol/L (ref 20–29)
Calcium: 9.2 mg/dL (ref 8.7–10.3)
Chloride: 103 mmol/L (ref 96–106)
Creatinine, Ser: 0.94 mg/dL (ref 0.57–1.00)
GFR calc Af Amer: 67 mL/min/{1.73_m2} (ref >59)
GFR calc non Af Amer: 58 mL/min/{1.73_m2} — ABNORMAL LOW (ref >59)
Globulin, Total: 2.3 g/dL (ref 1.5–4.5)
Glucose: 92 mg/dL (ref 65–99)
Potassium: 4.5 mmol/L (ref 3.5–5.2)
Sodium: 141 mmol/L (ref 134–144)
Total Protein: 6.5 g/dL (ref 6.0–8.5)

## 2019-12-15 LAB — LIPID PANEL
Chol/HDL Ratio: 2.7 ratio (ref 0.0–4.4)
Cholesterol, Total: 162 mg/dL (ref 100–199)
HDL: 61 mg/dL (ref >39)
LDL Chol Calc (NIH): 87 mg/dL (ref 0–99)
Triglycerides: 72 mg/dL (ref 0–149)
VLDL Cholesterol Cal: 14 mg/dL (ref 5–40)

## 2019-12-15 LAB — CBC WITH DIFFERENTIAL/PLATELET
Basophils Absolute: 0 10*3/uL (ref 0.0–0.2)
Basos: 1 %
EOS (ABSOLUTE): 0.1 10*3/uL (ref 0.0–0.4)
Eos: 2 %
Hematocrit: 37.6 % (ref 34.0–46.6)
Hemoglobin: 12.2 g/dL (ref 11.1–15.9)
Immature Grans (Abs): 0 10*3/uL (ref 0.0–0.1)
Immature Granulocytes: 0 %
Lymphocytes Absolute: 1.6 10*3/uL (ref 0.7–3.1)
Lymphs: 36 %
MCH: 31.1 pg (ref 26.6–33.0)
MCHC: 32.4 g/dL (ref 31.5–35.7)
MCV: 96 fL (ref 79–97)
Monocytes Absolute: 0.5 10*3/uL (ref 0.1–0.9)
Monocytes: 10 %
Neutrophils Absolute: 2.3 10*3/uL (ref 1.4–7.0)
Neutrophils: 51 %
Platelets: 224 10*3/uL (ref 150–450)
RBC: 3.92 x10E6/uL (ref 3.77–5.28)
RDW: 12.3 % (ref 11.7–15.4)
WBC: 4.5 10*3/uL (ref 3.4–10.8)

## 2019-12-15 LAB — TSH: TSH: 5.14 u[IU]/mL — ABNORMAL HIGH (ref 0.450–4.500)

## 2019-12-15 MED ORDER — TRAMADOL HCL 50 MG PO TABS: 50.0000 mg | ORAL_TABLET | Freq: Four times a day (QID) | ORAL | 2 refills | Status: DC | PRN

## 2020-01-17 ENCOUNTER — Ambulatory Visit
Admission: RE | Admit: 2020-01-17 | Discharge: 2020-01-17 | Disposition: A | Discharge status: Home / Self Care | Payer: Medicare Other | Source: Ambulatory Visit | Attending: Family Medicine | Admitting: Family Medicine

## 2020-01-17 ENCOUNTER — Other Ambulatory Visit: Payer: Self-pay

## 2020-01-17 DIAGNOSIS — Z780 Asymptomatic menopausal state: Secondary | ICD-10-CM | POA: Insufficient documentation

## 2020-01-17 DIAGNOSIS — Z1231 Encounter for screening mammogram for malignant neoplasm of breast: Secondary | ICD-10-CM | POA: Diagnosis present

## 2020-01-19 ENCOUNTER — Telehealth: Payer: Self-pay

## 2020-01-19 NOTE — Telephone Encounter (Signed)
Patient advised of lab results through mychart and has read the providers comments.  °

## 2020-01-19 NOTE — Telephone Encounter (Signed)
-----   Message from Jerrol Banana., MD sent at 01/19/2020 12:55 PM EDT ----- Osteopenia.  Continue walking and calcium and vitamin D daily.

## 2020-02-13 ENCOUNTER — Ambulatory Visit: Payer: Medicare Other | Admitting: Family Medicine

## 2020-02-13 NOTE — Progress Notes (Signed)
I,Michelle Lutz,acting as a scribe for Michelle Durie, MD.,have documented all relevant documentation on the behalf of Michelle Durie, MD,as directed by  Michelle Durie, MD while in the presence of Michelle Durie, MD.  Annual Wellness Visit     Patient: Michelle Lutz, Female    DOB: 1940-09-24, 79 y.o.   MRN: [id number] Visit Date: 02/14/2020  Today's Provider: Wilhemena Durie, MD   Chief Complaint  Patient presents with  . Medicare Wellness   Subjective    Michelle Lutz is a 79 y.o. female who presents today for her Annual Wellness Visit. She reports consuming a general diet. The patient does not participate in regular exercise at present. She generally feels well. She reports sleeping fairly well. She does not have additional problems to discuss today.   HPI       Medications: Outpatient Medications Prior to Visit  Medication Sig  . Biotin 10 MG CAPS Take 10 mg by mouth daily.   . Calcium Carbonate-Vitamin D3 (CALCIUM 600-D) 600-400 MG-UNIT TABS Take 1 tablet by mouth 2 (two) times daily.  . carboxymethylcellulose (REFRESH PLUS) 0.5 % SOLN Place 1 drop into both eyes 4 (four) times daily as needed (dry eyes).   . cetirizine (ZYRTEC) 10 MG tablet Take 10 mg by mouth daily.  . Cholecalciferol (VITAMIN D-1000 MAX ST) 25 MCG (1000 UT) tablet Take 1,000 Units by mouth daily.   . Cranberry 450 MG TABS Take 450 mg by mouth daily.  Marland Kitchen desonide (DESOWEN) 0.05 % cream Apply 1 application topically 2 (two) times daily.  . diphenoxylate-atropine (LOMOTIL) 2.5-0.025 MG tablet Take 1 tablet by mouth 4 (four) times daily as needed for diarrhea or loose stools.  . fluticasone (FLONASE) 50 MCG/ACT nasal spray Place 2 sprays into both nostrils daily.  Marland Kitchen gabapentin (NEURONTIN) 100 MG capsule Take 1 capsule (100 mg total) by mouth 3 (three) times daily.  . hydrochlorothiazide (MICROZIDE) 12.5 MG capsule Take 1 capsule (12.5 mg total) by mouth daily.  Marland Kitchen  HYDROcodone-acetaminophen (NORCO) 7.5-325 MG tablet Take 1 tablet by mouth every 6 (six) hours as needed for moderate pain or severe pain.   Marland Kitchen irbesartan (AVAPRO) 300 MG tablet Take 1 tablet (300 mg total) by mouth daily.  . metroNIDAZOLE (METROCREAM) 0.75 % cream Apply 1 application topically 2 (two) times daily.  . montelukast (SINGULAIR) 10 MG tablet Take 1 tablet (10 mg total) by mouth at bedtime.  . Multiple Vitamin (MULTIVITAMIN) capsule Take 1 capsule by mouth daily.  Marland Kitchen omeprazole (PRILOSEC) 20 MG capsule Take 20 mg by mouth daily.  . Probiotic Product (ALIGN) 4 MG CAPS Take 4 mg by mouth daily.   . sodium chloride (MURO 128) 5 % ophthalmic ointment Place 1 application into both eyes as needed for eye irritation.   . traMADol (ULTRAM) 50 MG tablet Take 1 tablet (50 mg total) by mouth every 6 (six) hours as needed for moderate pain.  Marland Kitchen zolpidem (AMBIEN) 5 MG tablet Take 5 mg by mouth at bedtime as needed for sleep.  . [DISCONTINUED] doxazosin (CARDURA) 2 MG tablet Take 1 tablet (2 mg total) by mouth daily.  . [DISCONTINUED] ipratropium (ATROVENT) 0.06 % nasal spray Place 2 sprays into both nostrils 2 (two) times daily.  . [DISCONTINUED] pravastatin (PRAVACHOL) 40 MG tablet Take 1 tablet (40 mg total) by mouth daily.  . cyclobenzaprine (FLEXERIL) 5 MG tablet Take 1 tablet (5 mg total) by mouth 3 (three) times daily as needed for  muscle spasms. (Patient not taking: Reported on 08/09/2019)  . methocarbamol (ROBAXIN) 500 MG tablet Take 1 tablet (500 mg total) by mouth 4 (four) times daily. (Patient not taking: Reported on 02/08/2019)  . predniSONE (STERAPRED UNI-PAK 21 TAB) 10 MG (21) TBPK tablet Take by mouth 6 tablets day 1, 5 tablets day 2, 4 tablets day 3, 3 tablets day 4, 2 tablets day 5, 1 tablet day 6 (Patient not taking: Reported on 07/11/2019)  . sulfamethoxazole-trimethoprim (BACTRIM DS) 800-160 MG tablet Take 1 tablet by mouth 2 (two) times daily. (Patient not taking: Reported on 08/09/2019)    No facility-administered medications prior to visit.    Allergies  Allergen Reactions  . Aspirin   . Benazepril Cough  . Zocor [Simvastatin]     Caused pain    Patient Care Team: Jerrol Banana., MD as PCP - General (Family Medicine)  Review of Systems  HENT: Positive for dental problem, postnasal drip and rhinorrhea.   All other systems reviewed and are negative.     Objective    Vitals: BP 135/71 (BP Location: Right Arm, Patient Position: Supine, Cuff Size: Large)   Pulse 66   Temp 98.4 F (36.9 C) (Oral)   Resp 16   Ht 5\' 4"  (1.626 m)   Wt 175 lb (79.4 kg)   SpO2 98%   BMI 30.04 kg/m    Physical Exam  General Appearance:    Overweight female. Alert, cooperative, in no acute distress, appears stated age   Head:    Normocephalic, without obvious abnormality, atraumatic  Eyes:    PERRL, conjunctiva/corneas clear, EOM's intact, fundi    benign, both eyes  Ears:    Normal TM's and external ear canals, both ears  Nose:   Nares normal, septum midline, mucosa normal, no drainage    or sinus tenderness  Throat:   Lips, mucosa, and tongue normal; teeth and gums normal  Neck:   Supple, symmetrical, trachea midline, no adenopathy;    thyroid:  no enlargement/tenderness/nodules; no carotid   bruit or JVD  Back:     Symmetric, no curvature, ROM normal, no CVA tenderness  Lungs:     Clear to auscultation bilaterally, respirations unlabored  Chest Wall:    No tenderness or deformity   Heart:    Normal heart rate. Normal rhythm. No murmurs, rubs, or gallops.   Breast Exam:    normal appearance, no masses or tenderness, deferred  Abdomen:     Soft, non-tender, bowel sounds active all four quadrants,    no masses, no organomegaly  Pelvic:    deferred  Extremities:   All extremities are intact. No cyanosis or edema  Pulses:   2+ and symmetric all extremities  Skin:   Skin color, texture, turgor normal, no rashes or lesions  Lymph nodes:   Cervical, supraclavicular,  and axillary nodes normal  Neurologic:   CNII-XII intact, normal strength, sensation and reflexes    throughout     Most recent functional status assessment: In your present state of health, do you have any difficulty performing the following activities: 02/14/2020  Hearing? Y  Vision? N  Difficulty concentrating or making decisions? N  Walking or climbing stairs? Y  Dressing or bathing? N  Doing errands, shopping? N  Some recent data might be hidden   Most recent fall risk assessment: Fall Risk  02/14/2020  Falls in the past year? 0  Number falls in past yr: 0  Injury with Fall? 0  Follow  up Falls evaluation completed    Most recent depression screenings: PHQ 2/9 Scores 02/14/2020 02/08/2019  PHQ - 2 Score 0 0  PHQ- 9 Score 2 0   Most recent cognitive screening: 6CIT Screen 02/14/2020  What Year? 0 points  What month? 0 points  What time? 0 points  Count back from 20 0 points  Months in reverse 0 points  Repeat phrase 4 points  Total Score 4   Most recent Audit-C alcohol use screening Alcohol Use Disorder Test (AUDIT) 02/14/2020  1. How often do you have a drink containing alcohol? 0  2. How many drinks containing alcohol do you have on a typical day when you are drinking? 0  3. How often do you have six or more drinks on one occasion? 0  AUDIT-C Score 0  Alcohol Brief Interventions/Follow-up AUDIT Score <7 follow-up not indicated   A score of 3 or more in women, and 4 or more in men indicates increased risk for alcohol abuse, EXCEPT if all of the points are from question 1   No results found for any visits on 02/14/20.  Assessment & Plan     Annual wellness visit done today including the all of the following: Reviewed patient's Family Medical History Reviewed and updated list of patient's medical providers Assessment of cognitive impairment was done Assessed patient's functional ability Established a written schedule for health screening [place] Completed and Reviewed  Exercise Activities and Dietary recommendations Goals   None     Immunization History  Administered Date(s) Administered  . Fluad Quad(high Dose 65+) 02/14/2020  . Influenza, High Dose Seasonal PF 03/04/2017, 03/16/2018, 02/08/2019  . Influenza, Seasonal, Injecte, Preservative Fre 03/01/2012, 03/18/2013, 03/10/2014  . Influenza,inj,Quad PF,6+ Mos 03/23/2015, 04/09/2016  . PFIZER SARS-COV-2 Vaccination 06/01/2019, 06/25/2019  . Pneumococcal Conjugate-13 09/07/2013  . Pneumococcal Polysaccharide-23 07/22/2007  . Tdap 05/25/1996, 10/21/2006, 08/06/2011  . Zoster 08/02/2007  . Zoster Recombinat (Shingrix) 10/09/2016, 12/20/2016    Health Maintenance  Topic Date Due  . Hepatitis C Screening  Never done  . TETANUS/TDAP  08/05/2021  . INFLUENZA VACCINE  Completed  . DEXA SCAN  Completed  . COVID-19 Vaccine  Completed  . PNA vac Low Risk Adult  Completed     Discussed health benefits of physical activity, and encouraged her to engage in regular exercise appropriate for her age and condition.    1. Medicare annual wellness visit, subsequent  - CBC w/Diff/Platelet  2. Essential hypertension Patient states she feels lethargic when her blood pressure is down around 110s over 60s.  She will discuss Cardura with her cardiologist who started this. - doxazosin (CARDURA) 2 MG tablet; Take 1 tablet (2 mg total) by mouth daily.  Dispense: 90 tablet; Refill: 1  3. Need for influenza vaccination  - Flu Vaccine QUAD High Dose(Fluad)  4. Benign essential hypertension  - CBC w/Diff/Platelet  5. Hyperlipidemia, unspecified hyperlipidemia type  - pravastatin (PRAVACHOL) 40 MG tablet; Take 1 tablet (40 mg total) by mouth daily.  Dispense: 30 tablet; Refill: 11  6. Chronic rhinitis  - ipratropium (ATROVENT) 0.06 % nasal spray; Place 2 sprays into both nostrils 2 (two) times daily.  Dispense: 15 mL; Refill: 5 7.BCC right Ankle  Return in about 6 months  (around 08/13/2020).     I, Michelle Durie, MD, have reviewed all documentation for this visit. The documentation on 02/18/20 for the exam, diagnosis, procedures, and orders are all accurate and complete.    Richard Rosanna Randy  [place], [place] [phone] (phone) [phone] (fax)  [place]

## 2020-02-14 ENCOUNTER — Encounter: Payer: Self-pay | Admitting: Family Medicine

## 2020-02-14 ENCOUNTER — Ambulatory Visit (INDEPENDENT_AMBULATORY_CARE_PROVIDER_SITE_OTHER): Payer: Medicare Other | Admitting: Family Medicine

## 2020-02-14 ENCOUNTER — Other Ambulatory Visit: Payer: Self-pay

## 2020-02-14 VITALS — BP 135/71 | HR 66 | Temp 98.4°F | Resp 16 | Ht 64.0 in | Wt 175.0 lb

## 2020-02-14 DIAGNOSIS — Z0000 Encounter for general adult medical examination without abnormal findings: Secondary | ICD-10-CM

## 2020-02-14 DIAGNOSIS — I10 Essential (primary) hypertension: Secondary | ICD-10-CM

## 2020-02-14 DIAGNOSIS — Z23 Encounter for immunization: Secondary | ICD-10-CM | POA: Diagnosis not present

## 2020-02-14 DIAGNOSIS — E785 Hyperlipidemia, unspecified: Secondary | ICD-10-CM

## 2020-02-14 DIAGNOSIS — J310 Chronic rhinitis: Secondary | ICD-10-CM

## 2020-02-14 MED ORDER — DOXAZOSIN MESYLATE 2 MG PO TABS: 2.0000 mg | ORAL_TABLET | Freq: Every day | ORAL | 1 refills | Status: DC

## 2020-02-14 MED ORDER — PRAVASTATIN SODIUM 40 MG PO TABS: 40.0000 mg | ORAL_TABLET | Freq: Every day | ORAL | 11 refills | Status: DC

## 2020-02-14 MED ORDER — IPRATROPIUM BROMIDE 0.06 % NA SOLN: 2.0000 | Freq: Two times a day (BID) | NASAL | 5 refills | Status: AC

## 2020-02-15 ENCOUNTER — Telehealth: Payer: Self-pay

## 2020-02-15 ENCOUNTER — Encounter: Payer: Self-pay | Admitting: Family Medicine

## 2020-02-15 LAB — CBC WITH DIFFERENTIAL/PLATELET
Basophils Absolute: 0 10*3/uL (ref 0.0–0.2)
Basos: 1 %
EOS (ABSOLUTE): 0.3 10*3/uL (ref 0.0–0.4)
Eos: 4 %
Hematocrit: 38.3 % (ref 34.0–46.6)
Hemoglobin: 12.6 g/dL (ref 11.1–15.9)
Immature Grans (Abs): 0 10*3/uL (ref 0.0–0.1)
Immature Granulocytes: 0 %
Lymphocytes Absolute: 2.2 10*3/uL (ref 0.7–3.1)
Lymphs: 36 %
MCH: 31.4 pg (ref 26.6–33.0)
MCHC: 32.9 g/dL (ref 31.5–35.7)
MCV: 96 fL (ref 79–97)
Monocytes Absolute: 0.5 10*3/uL (ref 0.1–0.9)
Monocytes: 8 %
Neutrophils Absolute: 3 10*3/uL (ref 1.4–7.0)
Neutrophils: 51 %
Platelets: 226 10*3/uL (ref 150–450)
RBC: 4.01 x10E6/uL (ref 3.77–5.28)
RDW: 11.8 % (ref 11.7–15.4)
WBC: 6 10*3/uL (ref 3.4–10.8)

## 2020-02-15 NOTE — Telephone Encounter (Signed)
Patient advised of lab results via mychart and has read the providers comments.

## 2020-02-15 NOTE — Telephone Encounter (Signed)
-----   Message from Jerrol Banana., MD sent at 02/15/2020  8:00 AM EDT ----- Normal CBC,.

## 2020-05-03 ENCOUNTER — Encounter: Payer: Self-pay | Admitting: Family Medicine

## 2020-05-03 DIAGNOSIS — I10 Essential (primary) hypertension: Secondary | ICD-10-CM

## 2020-05-03 DIAGNOSIS — Z1283 Encounter for screening for malignant neoplasm of skin: Secondary | ICD-10-CM

## 2020-05-03 NOTE — Telephone Encounter (Signed)
Please refer to Dr. Rockey Situ for cardiology and to [facility] dermatology.  Thank you

## 2020-05-07 ENCOUNTER — Other Ambulatory Visit: Payer: Self-pay | Admitting: Family Medicine

## 2020-05-07 DIAGNOSIS — I10 Essential (primary) hypertension: Secondary | ICD-10-CM

## 2020-05-14 ENCOUNTER — Other Ambulatory Visit: Payer: Self-pay | Admitting: Family Medicine

## 2020-05-14 DIAGNOSIS — I10 Essential (primary) hypertension: Secondary | ICD-10-CM

## 2020-06-06 ENCOUNTER — Encounter: Payer: Self-pay | Admitting: Cardiovascular Disease

## 2020-06-06 ENCOUNTER — Other Ambulatory Visit: Payer: Self-pay

## 2020-06-06 ENCOUNTER — Ambulatory Visit: Payer: Medicare Other | Admitting: Cardiovascular Disease

## 2020-06-06 VITALS — BP 146/90 | HR 70 | Ht 64.0 in | Wt 175.0 lb

## 2020-06-06 DIAGNOSIS — G4733 Obstructive sleep apnea (adult) (pediatric): Secondary | ICD-10-CM | POA: Diagnosis not present

## 2020-06-06 DIAGNOSIS — I10 Essential (primary) hypertension: Secondary | ICD-10-CM

## 2020-06-06 DIAGNOSIS — E782 Mixed hyperlipidemia: Secondary | ICD-10-CM | POA: Diagnosis not present

## 2020-06-06 DIAGNOSIS — R55 Syncope and collapse: Secondary | ICD-10-CM

## 2020-06-06 DIAGNOSIS — Z9989 Dependence on other enabling machines and devices: Secondary | ICD-10-CM

## 2020-06-06 NOTE — Progress Notes (Addendum)
Cardiology Office Note  Date:  06/06/2020   ID:  Michelle Lutz, DOB Nov 27, 1940, MRN [id number]  PCP:  Jerrol Banana., MD   Chief Complaint  Patient presents with   New Patient (Initial Visit)    Referred by Dr. Rosanna Randy for HTN; pt states no cardiac Sx.    HPI:  [name] is a 80 year old woman with past medical history of Hypertension Spinal stenosis Hyperlipidemia OSA on CPAP Cath 2003: no CAD Syncope, vasovagal (better off verapamil) Who presents for new patient visit for hypertension, near syncope syncope  [place]  She details prior history of near syncope and syncope Episode last week of near syncope, was cooking, 02/28/2019 was a "big one" Other episodes with dates below 02/08/18 10/21/17 Jan 2019 Reports that some of her episodes, systolic pressures in the 70s Symptoms have improved by holding verapamil, now does not have many episodes and not as severe  Denies any triggers, no pain, no bowel movements etc. Does not drink much fluids She is on HCTZ daily  Walks some, no regular exercise program but likes to go shopping [employer], [employer]  Current medication list reviewed with her  on irbesartan, HCTZ 12.5 daily in the AM Cardura 2 mg daily, BM  For hyperlipidemia on pravastatin 40  EKG personally reviewed by myself on todays visit Normal sinus rhythm rate 70 bpm no significant ST or T wave changes  Lab Results  Component Value Date   CHOL 162 12/14/2019   HDL 61 12/14/2019   LDLCALC 87 12/14/2019   TRIG 72 12/14/2019    PMH:   has a past medical history of Allergy, Arthritis, Cataract, GERD (gastroesophageal reflux disease), Hypertension, Osteoarthritis, Sleep apnea, and Spinal stenosis.  PSH:    Past Surgical History:  Procedure Laterality Date   BREAST BIOPSY     20+ years   BREAST CYST ASPIRATION     20+ years   BREAST CYST EXCISION     BREAST SURGERY     CESAREAN SECTION      EYE SURGERY     FOOT SURGERY     JOINT REPLACEMENT Right 07/07/2016   Partial R. knee in 2008 then complete in 2018   uterine prolapse repair     Tacking uterus    Current Outpatient Medications  Medication Sig Dispense Refill   Biotin 10 MG CAPS Take 10 mg by mouth daily.      Calcium Carb-Cholecalciferol (CALCIUM CARBONATE-VITAMIN D3) 600-400 MG-UNIT TABS Take 1 tablet by mouth 2 (two) times daily.     carboxymethylcellulose (REFRESH PLUS) 0.5 % SOLN Place 1 drop into both eyes 4 (four) times daily as needed (dry eyes).      cetirizine (ZYRTEC) 10 MG tablet Take 10 mg by mouth daily.     Cholecalciferol 25 MCG (1000 UT) tablet Take 1,000 Units by mouth daily.      Cranberry 450 MG TABS Take 450 mg by mouth daily.     cyclobenzaprine (FLEXERIL) 5 MG tablet Take 1 tablet (5 mg total) by mouth 3 (three) times daily as needed for muscle spasms. 30 tablet 5   diphenoxylate-atropine (LOMOTIL) 2.5-0.025 MG tablet Take 1 tablet by mouth 4 (four) times daily as needed for diarrhea or loose stools.     doxazosin (CARDURA) 2 MG tablet Take 1 tablet by mouth once daily 90 tablet 3   fluticasone (FLONASE) 50 MCG/ACT nasal spray Place 2 sprays into both nostrils daily. 18.2 mL 3  gabapentin (NEURONTIN) 100 MG capsule Take 1 capsule (100 mg total) by mouth 3 (three) times daily. 90 capsule 5   hydrochlorothiazide (MICROZIDE) 12.5 MG capsule Take 1 capsule (12.5 mg total) by mouth daily. 90 capsule 3   HYDROcodone-acetaminophen (NORCO) 7.5-325 MG tablet Take 1 tablet by mouth every 6 (six) hours as needed for moderate pain or severe pain.      ipratropium (ATROVENT) 0.06 % nasal spray Place 2 sprays into both nostrils 2 (two) times daily. 15 mL 5   irbesartan (AVAPRO) 300 MG tablet Take 1 tablet (300 mg total) by mouth daily. 90 tablet 1   methocarbamol (ROBAXIN) 500 MG tablet Take 1 tablet (500 mg total) by mouth 4 (four) times daily. 30 tablet 1   metroNIDAZOLE (METROCREAM) 0.75 % cream Apply 1  application topically 2 (two) times daily.     montelukast (SINGULAIR) 10 MG tablet Take 1 tablet (10 mg total) by mouth at bedtime. 90 tablet 3   Multiple Vitamin (MULTIVITAMIN) capsule Take 1 capsule by mouth daily.     omeprazole (PRILOSEC) 20 MG capsule Take 20 mg by mouth daily.     pravastatin (PRAVACHOL) 40 MG tablet Take 1 tablet (40 mg total) by mouth daily. 30 tablet 11   predniSONE (STERAPRED UNI-PAK 21 TAB) 10 MG (21) TBPK tablet Take by mouth 6 tablets day 1, 5 tablets day 2, 4 tablets day 3, 3 tablets day 4, 2 tablets day 5, 1 tablet day 6 21 tablet 2   Probiotic Product (ALIGN) 4 MG CAPS Take 4 mg by mouth daily.      sodium chloride (MURO 128) 5 % ophthalmic ointment Place 1 application into both eyes as needed for eye irritation.      sulfamethoxazole-trimethoprim (BACTRIM DS) 800-160 MG tablet Take 1 tablet by mouth 2 (two) times daily. 10 tablet 0   traMADol (ULTRAM) 50 MG tablet Take 1 tablet (50 mg total) by mouth every 6 (six) hours as needed for moderate pain. 30 tablet 2   zolpidem (AMBIEN) 5 MG tablet Take 5 mg by mouth at bedtime as needed for sleep.     No current facility-administered medications for this visit.     Allergies:   Aspirin, Benazepril, and Zocor [simvastatin]   Social History:  The patient  reports that she has never smoked. She has never used smokeless tobacco.  No alcohol use . she reports current drug use.   Family History:   family history includes Arthritis in her brother and father; COPD in her father; Heart disease in her brother, father, and mother; Hypertension in her brother; Kidney disease in her mother; Macular degeneration in her mother.    Review of Systems: Review of Systems  Constitutional: Negative.   HENT: Negative.   Respiratory: Negative.   Cardiovascular: Negative.   Gastrointestinal: Negative.   Musculoskeletal: Negative.   Neurological: Negative.   Psychiatric/Behavioral: Negative.   All other systems reviewed and are  negative.   PHYSICAL EXAM: VS:  BP (!) 146/90   Pulse 70   Ht 5\' 4"  (1.626 m)   Wt 175 lb (79.4 kg)   BMI 30.04 kg/m  , BMI Body mass index is 30.04 kg/m. GEN: Well nourished, well developed, in no acute distress HEENT: normal Neck: no JVD, carotid bruits, or masses Cardiac: RRR; no murmurs, rubs, or gallops,no edema  Respiratory:  clear to auscultation bilaterally, normal work of breathing GI: soft, nontender, nondistended, + BS MS: no deformity or atrophy Skin: warm and dry, no  rash Neuro:  Strength and sensation are intact Psych: euthymic mood, full affect   Recent Labs: 12/14/2019: ALT 14; BUN 13; Creatinine, Ser 0.94; Potassium 4.5; Sodium 141; TSH 5.140 02/14/2020: Hemoglobin 12.6; Platelets 226    Lipid Panel Lab Results  Component Value Date   CHOL 162 12/14/2019   HDL 61 12/14/2019   LDLCALC 87 12/14/2019   TRIG 72 12/14/2019      Wt Readings from Last 3 Encounters:  06/06/20 175 lb (79.4 kg)  02/14/20 175 lb (79.4 kg)  12/12/19 181 lb (82.1 kg)       ASSESSMENT AND PLAN:  Problem List Items Addressed This Visit       Cardiology Problems   Benign essential hypertension   Relevant Orders   EKG 12-Lead   Hyperlipidemia      Other Visit Diagnoses     Syncope and collapse    -  Primary      Syncope/near syncope Reports prior work-up with outpatient cardiology, On evaluation has documented low pressure sometimes down into the 70s Markedly fewer episodes by holding verapamil We will continue to monitor for now, recommend she stay hydrated -May need to hold HCTZ for continued episodes  Essential hypertension No changes made to current medications For further episodes of near syncope or syncope, would hold the HCTZ  Hyperlipidemia Tolerating pravastatin For any chest pain symptoms, could perform risk stratification with CT coronary calcium scoring  Obstructive sleep apnea On CPAP Suggest he talk with Dr. Rosanna Randy, may need periodic  evaluation by pulmonary    Total encounter time more than 60 minutes  Greater than 50% was spent in counseling and coordination of care with the patient    Signed, Esmond Plants, M.D., Ph.D. [place], [place]

## 2020-06-06 NOTE — Patient Instructions (Addendum)
Medication Instructions:  Please continue current medications  Lab work: None  Testing/Procedures: None   Follow-Up: At [facility], you and your health needs are our priority.  As part of our continuing mission to provide you with exceptional heart care, we have created designated Provider Care Teams.  These Care Teams include your primary Cardiologist (physician) and Advanced Practice Providers (APPs -  Physician Assistants and Nurse Practitioners) who all work together to provide you with the care you need, when you need it.  . You will need a follow up appointment in 6 months  . Providers on your designated Care Team:   . Murray Hodgkins, NP . Christell Faith, PA-C . Marrianne Mood, PA-C   COVID-19 Vaccine Information can be found at: ShippingScam.co.uk For questions related to vaccine distribution or appointments, please email [email or web address] .[email or web address] or call [phone].

## 2020-06-14 ENCOUNTER — Other Ambulatory Visit: Payer: Self-pay | Admitting: Family Medicine

## 2020-06-14 NOTE — Telephone Encounter (Signed)
Courtesy refill provided. Appt scheduled on 08/13/20

## 2020-07-25 DIAGNOSIS — G4733 Obstructive sleep apnea (adult) (pediatric): Secondary | ICD-10-CM | POA: Diagnosis not present

## 2020-08-13 ENCOUNTER — Other Ambulatory Visit: Payer: Self-pay

## 2020-08-13 ENCOUNTER — Encounter: Payer: Self-pay | Admitting: Family Medicine

## 2020-08-13 ENCOUNTER — Ambulatory Visit (INDEPENDENT_AMBULATORY_CARE_PROVIDER_SITE_OTHER): Payer: Medicare Other | Admitting: Family Medicine

## 2020-08-13 VITALS — BP 146/79 | HR 67 | Temp 98.0°F | Resp 18 | Ht 64.0 in | Wt 178.0 lb

## 2020-08-13 DIAGNOSIS — E7800 Pure hypercholesterolemia, unspecified: Secondary | ICD-10-CM | POA: Diagnosis not present

## 2020-08-13 DIAGNOSIS — G4733 Obstructive sleep apnea (adult) (pediatric): Secondary | ICD-10-CM | POA: Diagnosis not present

## 2020-08-13 DIAGNOSIS — M4726 Other spondylosis with radiculopathy, lumbar region: Secondary | ICD-10-CM

## 2020-08-13 DIAGNOSIS — I10 Essential (primary) hypertension: Secondary | ICD-10-CM | POA: Diagnosis not present

## 2020-08-13 DIAGNOSIS — F5104 Psychophysiologic insomnia: Secondary | ICD-10-CM

## 2020-08-13 DIAGNOSIS — K219 Gastro-esophageal reflux disease without esophagitis: Secondary | ICD-10-CM

## 2020-08-13 NOTE — Progress Notes (Signed)
I,[name],acting as a scribe for Wilhemena Durie, MD.,have documented all relevant documentation on the behalf of Wilhemena Durie, MD,as directed by  Wilhemena Durie, MD while in the presence of Wilhemena Durie, MD.   Established patient visit   Patient: Michelle Lutz   DOB: 21-Jun-1940   80 y.o. Female  MRN: [id number] Visit Date: 08/13/2020  Today's healthcare provider: Wilhemena Durie, MD   Chief Complaint  Patient presents with  . Follow-up  . Hypertension   Subjective    HPI  Patient is feeling well.  She and her husband just returned from a cruise to the [place]. She still gets lethargic ever blood pressure dips low 253 systolic.  Most of her blood pressures are 120s to 140s.  He feels well with this.  She does have some apparent whitecoat hypertension Hypertension, follow-up  BP Readings from Last 3 Encounters:  08/13/20 (!) 146/79  06/06/20 (!) 146/90  02/14/20 135/71   Wt Readings from Last 3 Encounters:  08/13/20 178 lb (80.7 kg)  06/06/20 175 lb (79.4 kg)  02/14/20 175 lb (79.4 kg)     She was last seen for hypertension 6 months ago.  BP at that visit was 135/71. Management since that visit includes; Patient states she feels lethargic when her blood pressure is down around 110s over 60s.  She will discuss Cardura with her cardiologist who started this. She reports good compliance with treatment. She is not having side effects. none She is not exercising. She is adherent to low salt diet.   Outside blood pressures are 133/80.  She does not smoke.  Use of agents associated with hypertension: none.   --------------------------------------------------------------------       Medications: Outpatient Medications Prior to Visit  Medication Sig  . Biotin 10 MG CAPS Take 10 mg by mouth daily.   . Calcium Carb-Cholecalciferol (CALCIUM CARBONATE-VITAMIN D3) 600-400 MG-UNIT TABS Take 1 tablet by mouth 2 (two) times daily.  .  carboxymethylcellulose (REFRESH PLUS) 0.5 % SOLN Place 1 drop into both eyes 4 (four) times daily as needed (dry eyes).   . cetirizine (ZYRTEC) 10 MG tablet Take 10 mg by mouth daily.  . Cholecalciferol 25 MCG (1000 UT) tablet Take 1,000 Units by mouth daily.   . Cranberry 450 MG TABS Take 450 mg by mouth daily.  . diphenoxylate-atropine (LOMOTIL) 2.5-0.025 MG tablet Take 1 tablet by mouth 4 (four) times daily as needed for diarrhea or loose stools.  . doxazosin (CARDURA) 2 MG tablet Take 1 tablet by mouth once daily  . fluticasone (FLONASE) 50 MCG/ACT nasal spray Place 2 sprays into both nostrils daily.  Marland Kitchen gabapentin (NEURONTIN) 100 MG capsule Take 1 capsule (100 mg total) by mouth 3 (three) times daily.  . hydrochlorothiazide (MICROZIDE) 12.5 MG capsule Take 1 capsule (12.5 mg total) by mouth daily.  Marland Kitchen HYDROcodone-acetaminophen (NORCO) 7.5-325 MG tablet Take 1 tablet by mouth every 6 (six) hours as needed for moderate pain or severe pain.   Marland Kitchen ipratropium (ATROVENT) 0.06 % nasal spray Place 2 sprays into both nostrils 2 (two) times daily.  . irbesartan (AVAPRO) 300 MG tablet Take 1 tablet by mouth once daily  . metroNIDAZOLE (METROCREAM) 0.75 % cream Apply 1 application topically 2 (two) times daily.  . montelukast (SINGULAIR) 10 MG tablet Take 1 tablet (10 mg total) by mouth at bedtime.  . Multiple Vitamin (MULTIVITAMIN) capsule Take 1 capsule by mouth daily.  Marland Kitchen omeprazole (PRILOSEC) 20 MG capsule Take  20 mg by mouth daily.  . pravastatin (PRAVACHOL) 40 MG tablet Take 1 tablet (40 mg total) by mouth daily.  . Probiotic Product (ALIGN) 4 MG CAPS Take 4 mg by mouth daily.   . sodium chloride (MURO 128) 5 % ophthalmic ointment Place 1 application into both eyes as needed for eye irritation.   . sulfamethoxazole-trimethoprim (BACTRIM DS) 800-160 MG tablet Take 1 tablet by mouth 2 (two) times daily.  . traMADol (ULTRAM) 50 MG tablet Take 1 tablet (50 mg total) by mouth every 6 (six) hours as needed  for moderate pain.  Marland Kitchen zolpidem (AMBIEN) 5 MG tablet Take 5 mg by mouth at bedtime as needed for sleep.  . cyclobenzaprine (FLEXERIL) 5 MG tablet Take 1 tablet (5 mg total) by mouth 3 (three) times daily as needed for muscle spasms. (Patient not taking: Reported on 08/13/2020)  . methocarbamol (ROBAXIN) 500 MG tablet Take 1 tablet (500 mg total) by mouth 4 (four) times daily. (Patient not taking: Reported on 08/13/2020)  . predniSONE (STERAPRED UNI-PAK 21 TAB) 10 MG (21) TBPK tablet Take by mouth 6 tablets day 1, 5 tablets day 2, 4 tablets day 3, 3 tablets day 4, 2 tablets day 5, 1 tablet day 6 (Patient not taking: Reported on 08/13/2020)   No facility-administered medications prior to visit.    Review of Systems  Constitutional: Negative for appetite change, chills, fatigue and fever.  Respiratory: Negative for chest tightness and shortness of breath.   Cardiovascular: Negative for chest pain and palpitations.  Gastrointestinal: Negative for abdominal pain, nausea and vomiting.  Neurological: Negative for dizziness and weakness.        Objective    BP (!) 146/79 (BP Location: Left Arm, Patient Position: Sitting, Cuff Size: Large)   Pulse 67   Temp 98 F (36.7 C) (Oral)   Resp 18   Ht 5\' 4"  (1.626 m)   Wt 178 lb (80.7 kg)   SpO2 97%   BMI 30.55 kg/m  BP Readings from Last 3 Encounters:  08/13/20 (!) 146/79  06/06/20 (!) 146/90  02/14/20 135/71   Wt Readings from Last 3 Encounters:  08/13/20 178 lb (80.7 kg)  06/06/20 175 lb (79.4 kg)  02/14/20 175 lb (79.4 kg)       Physical Exam Vitals reviewed.  HENT:     Head: Normocephalic and atraumatic.     Right Ear: External ear normal.     Left Ear: External ear normal.  Eyes:     General: No scleral icterus.    Conjunctiva/sclera: Conjunctivae normal.  Neck:     Vascular: No carotid bruit.  Cardiovascular:     Rate and Rhythm: Normal rate and regular rhythm.     Heart sounds: Normal heart sounds.  Pulmonary:     Effort:  Pulmonary effort is normal.     Breath sounds: Normal breath sounds.  Chest:  Breasts:     Right: Normal.     Left: Normal.    Abdominal:     Palpations: Abdomen is soft.  Lymphadenopathy:     Cervical: No cervical adenopathy.  Skin:    General: Skin is warm and dry.  Neurological:     General: No focal deficit present.     Mental Status: She is alert and oriented to person, place, and time.  Psychiatric:        Mood and Affect: Mood normal.        Behavior: Behavior normal.        Thought  Content: Thought content normal.        Judgment: Judgment normal.       No results found for any visits on 08/13/20.  Assessment & Plan     1. Essential hypertension Good control on Cardura, HCTZ 12.5, irbesartan 300 mg,.  2. Obstructive sleep apnea syndrome   3. Osteoarthritis of spine with radiculopathy, lumbar region Chronic issue.  Use tramadol sparingly try to avoid NSAID  4. Hypercholesterolemia On pravastatin  5. Gastroesophageal reflux disease without esophagitis Needs PPI  6. Chronic insomnia To cut back on zolpidem.    No follow-ups on file.      I, Wilhemena Durie, MD, have reviewed all documentation for this visit. The documentation on 08/13/20 for the exam, diagnosis, procedures, and orders are all accurate and complete.    Verbon Giangregorio Cranford Mon, MD  [facility] [phone] (phone) [phone] (fax)  [place]

## 2020-08-17 DIAGNOSIS — G4733 Obstructive sleep apnea (adult) (pediatric): Secondary | ICD-10-CM | POA: Diagnosis not present

## 2020-08-21 ENCOUNTER — Encounter: Payer: Self-pay | Admitting: Adult Health

## 2020-08-21 ENCOUNTER — Ambulatory Visit: Payer: Self-pay | Admitting: *Deleted

## 2020-08-21 NOTE — Telephone Encounter (Signed)
Pt called with complaints of diarrhea x 3 days; her stool is watery; the pt also had sharp stomach pains like gas pains on 08/19/20 that has resolved; she has gurgling sounds which she believes is gas; the pt had 2 episodes of diarrhea on 08/19/20,  twice on PM 08/20/20 and 5 times on 08/21/20; she says there is urgency and there is a small amount stool; the pt said she went out town and may have eaten something that did not agree with her; she had a biscuit and japanese on 08/18/20; the pt says she took she has taken immodium 2 tabs at 2030 on 08/20/20 and 1 tab at 0530 and 1330 on 08/21/20; she also took Amoxicillin pre-med for a toot canal on 08/16/20; recommendations made per nurse triage protocol; the pt sees Dr Miguel Aschoff at [facility] but he has no availability within timeframe per guidelines; decision tree completed; pt offered and accepted MyChart appt with Laverna Peace 08/22/20 at 39; will route to office for notification.   Reason for Disposition . [1] Recent antibiotic therapy (i.e., within last 2 months) AND [2] diarrhea present > 3 days since antibiotic was stopped  Answer Assessment - Initial Assessment Questions 1. DIARRHEA SEVERITY: "How bad is the diarrhea?" "How many extra stools have you had in the past 24 hours than normal?"    - NO DIARRHEA (SCALE 0)   - MILD (SCALE 1-3): Few loose or mushy BMs; increase of 1-3 stools over normal daily number of stools; mild increase in ostomy output.   -  MODERATE (SCALE 4-7): Increase of 4-6 stools daily over normal; moderate increase in ostomy output. * SEVERE (SCALE 8-10; OR 'WORST POSSIBLE'): Increase of 7 or more stools daily over normal; moderate increase in ostomy output; incontinence.     Mild to moderate 2. ONSET: "When did the diarrhea begin?"      08/19/20 3. BM CONSISTENCY: "How loose or watery is the diarrhea?"     watery 4. VOMITING: "Are you also vomiting?" If Yes, ask: "How many times in the past 24 hours?"   no 5.  ABDOMINAL PAIN: "Are you having any abdominal pain?" If Yes, ask: "What does it feel like?" (e.g., crampy, dull, intermittent, constant)     Was described as sharp on 08/19/20; no resolved 6. ABDOMINAL PAIN SEVERITY: If present, ask: "How bad is the pain?"  (e.g., Scale 1-10; mild, moderate, or severe)   - MILD (1-3): doesn't interfere with normal activities, abdomen soft and not tender to touch    - MODERATE (4-7): interferes with normal activities or awakens from sleep, tender to touch    - SEVERE (8-10): excruciating pain, doubled over, unable to do any normal activities     Mild; pt has gurgling and "gas pain" 7. ORAL INTAKE: If vomiting, "Have you been able to drink liquids?" "How much fluids have you had in the past 24 hours?"    n/a 8. HYDRATION: "Any signs of dehydration?" (e.g., dry mouth [not just dry lips], too weak to stand, dizziness, new weight loss) "When did you last urinate?"     no 9. EXPOSURE: "Have you traveled to a foreign country recently?" "Have you been exposed to anyone with diarrhea?" "Could you have eaten any food that was spoiled?"     Pt went to [place] 10. ANTIBIOTIC USE: "Are you taking antibiotics now or have you taken antibiotics in the past 2 months?"       Yes Amoxicillan pre-med for root canal on 08/16/20  11. OTHER SYMPTOMS: "Do you have any other symptoms?" (e.g., fever, blood in stool)       Gas pains and gurgling stomach 12. PREGNANCY: "Is there any chance you are pregnant?" "When was your last menstrual period?"      no  Protocols used: DIARRHEA-A-AH

## 2020-08-22 ENCOUNTER — Other Ambulatory Visit: Payer: Self-pay | Admitting: Adult Health

## 2020-08-22 ENCOUNTER — Telehealth (INDEPENDENT_AMBULATORY_CARE_PROVIDER_SITE_OTHER): Payer: Medicare Other | Admitting: Adult Health

## 2020-08-22 ENCOUNTER — Encounter: Payer: Self-pay | Admitting: Adult Health

## 2020-08-22 DIAGNOSIS — R197 Diarrhea, unspecified: Secondary | ICD-10-CM | POA: Diagnosis not present

## 2020-08-22 MED ORDER — AZITHROMYCIN 250 MG PO TABS: ORAL_TABLET | ORAL | 0 refills | Status: DC

## 2020-08-22 NOTE — Telephone Encounter (Signed)
Since she has watery stools and was on antibiotics please place stool culture with C difficile test and have patient send someone to pick up specimen and bring back to lab. Keep office visit.

## 2020-08-22 NOTE — Progress Notes (Signed)
Virtual telephone visit    Virtual Visit via Telephone Note   This visit type was conducted due to national recommendations for restrictions regarding the COVID-19 Pandemic (e.g. social distancing) in an effort to limit this patient's exposure and mitigate transmission in our community. Due to her co-morbid illnesses, this patient is at least at moderate risk for complications without adequate follow up. This format is felt to be most appropriate for this patient at this time. The patient did not have access to video technology or had technical difficulties with video requiring transitioning to audio format only (telephone). Physical exam was limited to content and character of the telephone converstion.   Parties involved in visit as below:   Patient location: at home  Provider location: Provider: Provider's office at  [facility], [place].      I discussed the limitations of evaluation and management by telemedicine and the availability of in person appointments. The patient expressed understanding and agreed to proceed.   Visit Date: 08/22/2020  Today's healthcare provider: Marcille Buffy, FNP   No chief complaint on file.  Subjective    Diarrhea  This is a new problem. The current episode started in the past 7 days. The problem has been unchanged. The stool consistency is described as watery. The patient states that diarrhea awakens her from sleep. Associated symptoms include abdominal pain (" mild cramping " ), increased flatus and weight loss. Pertinent negatives include no arthralgias, bloating, chills, coughing, fever, headaches, myalgias, sweats, URI or vomiting. Risk factors include recent antibiotic use and suspect food intake. She has tried bismuth subsalicylate for the symptoms. The treatment provided no relief.    08/19/20 Sunday afternoon was onset of diarrhea. She does have mucous stools watery.  Denies any blood in stool, denies any  black tarry stools.  She was out of town in [place].  She denies any vomiting.  She did have 4 Amoxicillin pre medication for root canal last Thursday.  Mild abdominal generalized cramping.  She is drinking Gatorade and hydrating.   Patient  denies any fever, body aches,chills, rash, chest pain, shortness of breath, nausea, vomiting, or diarrhea.  Denies dizziness, lightheadedness, pre syncopal or syncopal episodes.   Denies any other symptoms or concerns.     Medications: Outpatient Medications Prior to Visit  Medication Sig  . Biotin 10 MG CAPS Take 10 mg by mouth daily.   . Calcium Carb-Cholecalciferol (CALCIUM CARBONATE-VITAMIN D3) 600-400 MG-UNIT TABS Take 1 tablet by mouth 2 (two) times daily.  . carboxymethylcellulose (REFRESH PLUS) 0.5 % SOLN Place 1 drop into both eyes 4 (four) times daily as needed (dry eyes).   . cetirizine (ZYRTEC) 10 MG tablet Take 10 mg by mouth daily.  . Cholecalciferol 25 MCG (1000 UT) tablet Take 1,000 Units by mouth daily.   . Cranberry 450 MG TABS Take 450 mg by mouth daily.  . cyclobenzaprine (FLEXERIL) 5 MG tablet Take 1 tablet (5 mg total) by mouth 3 (three) times daily as needed for muscle spasms. (Patient not taking: Reported on 08/13/2020)  . diphenoxylate-atropine (LOMOTIL) 2.5-0.025 MG tablet Take 1 tablet by mouth 4 (four) times daily as needed for diarrhea or loose stools.  . doxazosin (CARDURA) 2 MG tablet Take 1 tablet by mouth once daily  . fluticasone (FLONASE) 50 MCG/ACT nasal spray Place 2 sprays into both nostrils daily.  Marland Kitchen gabapentin (NEURONTIN) 100 MG capsule Take 1 capsule (100 mg total) by mouth 3 (three) times daily.  . hydrochlorothiazide (  MICROZIDE) 12.5 MG capsule Take 1 capsule (12.5 mg total) by mouth daily.  Marland Kitchen HYDROcodone-acetaminophen (NORCO) 7.5-325 MG tablet Take 1 tablet by mouth every 6 (six) hours as needed for moderate pain or severe pain.   Marland Kitchen ipratropium (ATROVENT) 0.06 % nasal spray Place 2 sprays into both nostrils  2 (two) times daily.  . irbesartan (AVAPRO) 300 MG tablet Take 1 tablet by mouth once daily  . methocarbamol (ROBAXIN) 500 MG tablet Take 1 tablet (500 mg total) by mouth 4 (four) times daily. (Patient not taking: Reported on 08/13/2020)  . metroNIDAZOLE (METROCREAM) 0.75 % cream Apply 1 application topically 2 (two) times daily.  . montelukast (SINGULAIR) 10 MG tablet Take 1 tablet (10 mg total) by mouth at bedtime.  . Multiple Vitamin (MULTIVITAMIN) capsule Take 1 capsule by mouth daily.  Marland Kitchen omeprazole (PRILOSEC) 20 MG capsule Take 20 mg by mouth daily.  . pravastatin (PRAVACHOL) 40 MG tablet Take 1 tablet (40 mg total) by mouth daily.  . predniSONE (STERAPRED UNI-PAK 21 TAB) 10 MG (21) TBPK tablet Take by mouth 6 tablets day 1, 5 tablets day 2, 4 tablets day 3, 3 tablets day 4, 2 tablets day 5, 1 tablet day 6 (Patient not taking: Reported on 08/13/2020)  . Probiotic Product (ALIGN) 4 MG CAPS Take 4 mg by mouth daily.   . sodium chloride (MURO 128) 5 % ophthalmic ointment Place 1 application into both eyes as needed for eye irritation.   . traMADol (ULTRAM) 50 MG tablet Take 1 tablet (50 mg total) by mouth every 6 (six) hours as needed for moderate pain.  Marland Kitchen zolpidem (AMBIEN) 5 MG tablet Take 5 mg by mouth at bedtime as needed for sleep.  . [DISCONTINUED] sulfamethoxazole-trimethoprim (BACTRIM DS) 800-160 MG tablet Take 1 tablet by mouth 2 (two) times daily.   No facility-administered medications prior to visit.    Review of Systems  Constitutional: Positive for weight loss. Negative for chills and fever.  HENT: Negative.   Respiratory: Negative.  Negative for cough.   Cardiovascular: Negative.   Gastrointestinal: Positive for abdominal pain (" mild cramping " ), diarrhea (mucous with water, denies any blood or black tarry stools. ) and flatus. Negative for abdominal distention, anal bleeding, bloating, blood in stool, constipation, nausea, rectal pain and vomiting.  Genitourinary: Negative.    Musculoskeletal: Negative.  Negative for arthralgias and myalgias.  Skin: Negative.  Negative for rash.  Neurological: Negative for headaches.  Psychiatric/Behavioral: Negative.  Negative for suicidal ideas.      Objective    There were no vitals taken for this visit.    Patient is alert and oriented and responsive to questions Engages in conversation with provider. Speaks in full sentences without any pauses without any shortness of breath or distress.    Assessment & Plan     Diarrhea of presumed infectious origin - Plan: azithromycin (ZITHROMAX) 250 MG tablet  C- diff stool culture ordered, she has already picked up specimen cup and will return to lab. Given she has recently taken antibiotics prior to root canal.  Monitor for signs of dehydration discussed seek care in person if any symptoms worsen at anytime.  Hydration advised. The 'BRAT' diet is suggested, then progress to diet as tolerated as symptoms abate. Call if bloody stools, persistent diarrhea, vomiting, fever or abdominal pain. Will cover for bacterial gastroenteritis as below. Also discussed viral etiology.  Meds ordered this encounter  Medications  . azithromycin (ZITHROMAX) 250 MG tablet    Sig: By  mouth Take 2 tablets day 1 (500mg  total) and 1 tablet ( 250 mg ) on days 2,3,4,5.    Dispense:  6 tablet    Refill:  0   Return in about 4 days (around 08/26/2020), or if symptoms worsen or fail to improve, for at any time for any worsening symptoms, Go to Emergency room/ urgent care if worse.   Red Flags discussed. The patient was given clear instructions to go to ER or return to medical center if any red flags develop, symptoms do not improve, worsen or new problems develop. They verbalized understanding.  I discussed the assessment and treatment plan with the patient. The patient was provided an opportunity to ask questions and all were answered. The patient agreed with the plan and demonstrated an understanding of  the instructions.   The patient was advised to call back or seek an in-person evaluation if the symptoms worsen or if the condition fails to improve as anticipated.  I provided 30 minutes of non-face-to-face time during this encounter.  The entirety of the information documented in the History of Present Illness, Review of Systems and Physical Exam were personally obtained by me. Portions of this information were initially documented by the CMA and reviewed by me for thoroughness and accuracy.     Marcille Buffy, [place] [phone] (phone) [phone] (fax)  [place]

## 2020-08-22 NOTE — Patient Instructions (Signed)
Chloral Hydrate capsules What is this medicine? CHLORAL HYDRATE (klor al HI drate) is used for the short-term treatment of insomnia. This medicine may also be used to decrease anxiety or make you sleep before a procedure or test. It also helps prevent or treat some of the symptoms caused by alcohol withdrawal. This medicine may be used for other purposes; ask your health care provider or pharmacist if you have questions. COMMON BRAND NAME(S): Somnote What should I tell my health care provider before I take this medicine? They need to know if you have any of these conditions:  history of a drug or alcohol abuse problem  heart disease  kidney disease  liver disease  mental depression  porphyria  stomach or intestinal disease or digestive problems  an unusual or allergic reaction to chloral hydrate, other medicines, foods, dyes, or preservatives  pregnant or trying to get pregnant  breast-feeding How should I use this medicine? Take this medicine by mouth with a full glass of water. Follow the directions on the prescription label. Take your doses at regular intervals after meals or as directed. Do not take your medicine more often than directed. If you have been taking this medicine regularly for some time, do not suddenly stop taking it. You must gradually reduce the dose or you may get severe side effects. Ask your doctor or health care professional for advice. Talk to your pediatrician regarding the use of this medicine in children. While this medicine may be prescribed for children for selected conditions, precautions do apply. Overdosage: If you think you have taken too much of this medicine contact a poison control center or emergency room at once. NOTE: This medicine is only for you. Do not share this medicine with others. What if I miss a dose? This does not apply if you are taking this before a procedure. If you are taking this medicine regularly and you miss a dose, take it  as soon as you can. If it is almost time for your next dose, take only that dose. Do not take double or extra doses. What may interact with this medicine? Do not take this medicine with any of the following medications:  disulfiram This medicine may also interact with the following medications:  alcohol  barbiturate medicines for inducing sleep or treating seizures  furosemide  medicines for anxiety or sleeping problems, such as diazepam or temazepam  medicines for hay fever and other allergies  medicines for pain  warfarin This list may not describe all possible interactions. Give your health care provider a list of all the medicines, herbs, non-prescription drugs, or dietary supplements you use. Also tell them if you smoke, drink alcohol, or use illegal drugs. Some items may interact with your medicine. What should I watch for while using this medicine? Visit your doctor or health care professional for regular checks on your progress. This medicine is for short-term periods of use. If you have been taking it regularly for some time, do not stop suddenly or you may get severe side effects. Ask your doctor or health care professional for advice. If you are taking this medicine for insomnia, talk to your doctor if you still have trouble sleeping within 7 to 10 days of using this medicine. This may mean there is another cause for your sleep problems. After taking this medicine, you may get up out of bed and do an activity that you do not know you are doing. The next morning, you may have no memory  of this. Activities include driving a car ("sleep-driving"), making and eating food, talking on the phone, sexual activity, and sleep-walking. Serious injuries have occurred. Call your doctor right away if you find out you have done any of these activities. Do not take this medicine if you have used alcohol that evening. Do not take it if you have taken another medicine for sleep. The risk of doing  these sleep-related activities is higher. If this medicine is being used for insomnia, do not take it unless you are able to stay in bed for a full night (7 to 8 hours) before you must be active again. You may have a decrease in mental alertness the day after use, even if you feel that you are fully awake. Tell your doctor if you will need to perform activities requiring full alertness, such as driving, the next day. Do not stand or sit up quickly after taking this medicine, especially if you are an older patient. This reduces the risk of dizzy or fainting spells. If you or your family notice any changes in your behavior, such as new or worsening depression, thoughts of harming yourself, anxiety, other unusual or disturbing thoughts, or memory loss, call your doctor right away. What side effects may I notice from receiving this medicine? Side effects that you should report to your doctor or health care professional as soon as possible:  allergic reactions like skin rash, itching or hives, swelling of the face, lips, or tongue  confusion  fever, chills, or sore throat  hallucinations, nightmares  staggering, tremors  unusually weak or tired Side effects that usually do not require medical attention (report to your doctor or health care professional if they continue or are bothersome):  diarrhea  dizziness, drowsiness, lightheadedness, or 'hangover'  nausea, vomiting  stomach upset This list may not describe all possible side effects. Call your doctor for medical advice about side effects. You may report side effects to FDA at 1-800-FDA-1088. Where should I keep my medicine? Keep out of the reach of children. This medicine can be abused. Keep your medicine in a safe place to protect it from theft. Do not share this medicine with anyone. Selling or giving away this medicine is dangerous and against the law. Store at room temperature between 15 and 30 degrees C (59 and 86 degrees F). Keep  container tightly closed. Throw away any unused medicine after the expiration date. NOTE: This sheet is a summary. It may not cover all possible information. If you have questions about this medicine, talk to your doctor, pharmacist, or health care provider.  2021 [employer] Standard (2017-11-05 09:35:09) Diarrhea, Adult Diarrhea is when you pass loose and watery poop (stool) often. Diarrhea can make you feel weak and cause you to lose water in your body (get dehydrated). Losing water in your body can cause you to:  Feel tired and thirsty.  Have a dry mouth.  Go pee (urinate) less often. Diarrhea often lasts 2-3 days. However, it can last longer if it is a sign of something more serious. It is important to treat your diarrhea as told by your doctor. Follow these instructions at home: Eating and drinking Follow these instructions as told by your doctor:  Take an ORS (oral rehydration solution). This is a drink that helps you replace fluids and minerals your body lost. It is sold at pharmacies and stores.  Drink plenty of fluids, such as: ? Water. ? Ice chips. ? Diluted fruit juice. ? Low-calorie sports drinks. ?  Milk, if you want.  Avoid drinking fluids that have a lot of sugar or caffeine in them.  Eat bland, easy-to-digest foods in small amounts as you are able. These foods include: ? Bananas. ? Applesauce. ? Rice. ? Low-fat (lean) meats. ? Toast. ? Crackers.  Avoid alcohol.  Avoid spicy or fatty foods.      Medicines  Take over-the-counter and prescription medicines only as told by your doctor.  If you were prescribed an antibiotic medicine, take it as told by your doctor. Do not stop using the antibiotic even if you start to feel better. General instructions  Wash your hands often using soap and water. If soap and water are not available, use a hand sanitizer. Others in your home should wash their hands as well. Hands should be washed: ? After using the toilet  or changing a diaper. ? Before preparing, cooking, or serving food. ? While caring for a sick person. ? While visiting someone in a hospital.  Drink enough fluid to keep your pee (urine) pale yellow.  Rest at home while you get better.  Watch your condition for any changes.  Take a warm bath to help with any burning or pain from having diarrhea.  Keep all follow-up visits as told by your doctor. This is important.   Contact a doctor if:  You have a fever.  Your diarrhea gets worse.  You have new symptoms.  You cannot keep fluids down.  You feel light-headed or dizzy.  You have a headache.  You have muscle cramps. Get help right away if:  You have chest pain.  You feel very weak or you pass out (faint).  You have bloody or black poop or poop that looks like tar.  You have very bad pain, cramping, or bloating in your belly (abdomen).  You have trouble breathing or you are breathing very quickly.  Your heart is beating very quickly.  Your skin feels cold and clammy.  You feel confused.  You have signs of losing too much water in your body, such as: ? Dark pee, very little pee, or no pee. ? Cracked lips. ? Dry mouth. ? Sunken eyes. ? Sleepiness. ? Weakness. Summary  Diarrhea is when you pass loose and watery poop (stool) often.  Diarrhea can make you feel weak and cause you to lose water in your body (get dehydrated).  Take an ORS (oral rehydration solution). This is a drink that is sold at pharmacies and stores.  Eat bland, easy-to-digest foods in small amounts as you are able.  Contact a doctor if your condition gets worse. Get help right away if you have signs that you have lost too much water in your body. This information is not intended to replace advice given to you by your health care provider. Make sure you discuss any questions you have with your health care provider. Document Revised: 10/16/2017 Document Reviewed: 10/16/2017 Elsevier Patient  Education  2021 [place]. Azithromycin tablets What is this medicine? AZITHROMYCIN (az ith roe MYE sin) is a macrolide antibiotic. It is used to treat or prevent certain kinds of bacterial infections. It will not work for colds, flu, or other viral infections. This medicine may be used for other purposes; ask your health care provider or pharmacist if you have questions. COMMON BRAND NAME(S): Zithromax, Zithromax Tri-Pak, Zithromax Z-Pak What should I tell my health care provider before I take this medicine? They need to know if you have any of these conditions:  history  of blood diseases, like leukemia  history of irregular heartbeat  kidney disease  liver disease  myasthenia gravis  an unusual or allergic reaction to azithromycin, erythromycin, other macrolide antibiotics, foods, dyes, or preservatives  pregnant or trying to get pregnant  breast-feeding How should I use this medicine? Take this medicine by mouth with a full glass of water. Follow the directions on the prescription label. The tablets can be taken with food or on an empty stomach. If the medicine upsets your stomach, take it with food. Take your medicine at regular intervals. Do not take your medicine more often than directed. Take all of your medicine as directed even if you think your are better. Do not skip doses or stop your medicine early. Talk to your pediatrician regarding the use of this medicine in children. While this drug may be prescribed for children as young as 6 months for selected conditions, precautions do apply. Overdosage: If you think you have taken too much of this medicine contact a poison control center or emergency room at once. NOTE: This medicine is only for you. Do not share this medicine with others. What if I miss a dose? If you miss a dose, take it as soon as you can. If it is almost time for your next dose, take only that dose. Do not take double or extra doses. What may interact  with this medicine? Do not take this medicine with any of the following medications:  cisapride  dronedarone  pimozide  thioridazine This medicine may also interact with the following medications:  antacids that contain aluminum or magnesium  birth control pills  colchicine  cyclosporine  digoxin  ergot alkaloids like dihydroergotamine, ergotamine  nelfinavir  other medicines that prolong the QT interval (an abnormal heart rhythm)  phenytoin  warfarin This list may not describe all possible interactions. Give your health care provider a list of all the medicines, herbs, non-prescription drugs, or dietary supplements you use. Also tell them if you smoke, drink alcohol, or use illegal drugs. Some items may interact with your medicine. What should I watch for while using this medicine? Tell your doctor or healthcare provider if your symptoms do not start to get better or if they get worse. This medicine may cause serious skin reactions. They can happen weeks to months after starting the medicine. Contact your healthcare provider right away if you notice fevers or flu-like symptoms with a rash. The rash may be red or purple and then turn into blisters or peeling of the skin. Or, you might notice a red rash with swelling of the face, lips or lymph nodes in your neck or under your arms. Do not treat diarrhea with over the counter products. Contact your doctor if you have diarrhea that lasts more than 2 days or if it is severe and watery. This medicine can make you more sensitive to the sun. Keep out of the sun. If you cannot avoid being in the sun, wear protective clothing and use sunscreen. Do not use sun lamps or tanning beds/booths. What side effects may I notice from receiving this medicine? Side effects that you should report to your doctor or health care professional as soon as possible:  allergic reactions like skin rash, itching or hives, swelling of the face, lips, or  tongue  bloody or watery diarrhea  breathing problems  chest pain  fast, irregular heartbeat  muscle weakness  rash, fever, and swollen lymph nodes  redness, blistering, peeling, or loosening of the  skin, including inside the mouth  signs and symptoms of liver injury like dark yellow or brown urine; general ill feeling or flu-like symptoms; light-colored stools; loss of appetite; nausea; right upper belly pain; unusually weak or tired; yellowing of the eyes or skin  white patches or sores in the mouth  unusually weak or tired Side effects that usually do not require medical attention (report to your doctor or health care professional if they continue or are bothersome):  diarrhea  nausea  stomach pain  vomiting This list may not describe all possible side effects. Call your doctor for medical advice about side effects. You may report side effects to FDA at 1-800-FDA-1088. Where should I keep my medicine? Keep out of the reach of children. Store at room temperature between 15 and 30 degrees C (59 and 86 degrees F). Throw away any unused medicine after the expiration date. NOTE: This sheet is a summary. It may not cover all possible information. If you have questions about this medicine, talk to your doctor, pharmacist, or health care provider.  2021 [employer]vier/[employer] Standard (2018-08-19 17:19:20)

## 2020-08-22 NOTE — Telephone Encounter (Signed)
Patient has been advised and will have to change appt to telephone visit today. KW

## 2020-08-22 NOTE — Telephone Encounter (Signed)
Order was placed please advise patient below.

## 2020-08-22 NOTE — Telephone Encounter (Signed)
Noted  

## 2020-08-23 DIAGNOSIS — R197 Diarrhea, unspecified: Secondary | ICD-10-CM | POA: Diagnosis not present

## 2020-08-26 NOTE — Progress Notes (Signed)
Stool culture still pending, is negative for C difficile,salmonella, e- coli . Other testing still pending will result once completed. [facility]ase follow up with her to see how she is doing.

## 2020-08-28 LAB — CDIFF NAA+O+P+STOOL CULTURE
E coli, Shiga toxin Assay: NEGATIVE
Toxigenic C. Difficile by PCR: NEGATIVE

## 2020-09-03 ENCOUNTER — Encounter: Payer: Self-pay | Admitting: Emergency Medicine

## 2020-09-03 ENCOUNTER — Emergency Department: Payer: Medicare Other

## 2020-09-03 ENCOUNTER — Emergency Department
Admission: EM | Admit: 2020-09-03 | Discharge: 2020-09-03 | Disposition: A | Discharge status: Home / Self Care | Payer: Medicare Other | Attending: Emergency Medicine | Admitting: Emergency Medicine

## 2020-09-03 ENCOUNTER — Other Ambulatory Visit: Payer: Self-pay

## 2020-09-03 DIAGNOSIS — Z96651 Presence of right artificial knee joint: Secondary | ICD-10-CM | POA: Diagnosis not present

## 2020-09-03 DIAGNOSIS — W19XXXA Unspecified fall, initial encounter: Secondary | ICD-10-CM

## 2020-09-03 DIAGNOSIS — Z79899 Other long term (current) drug therapy: Secondary | ICD-10-CM | POA: Diagnosis not present

## 2020-09-03 DIAGNOSIS — H05232 Hemorrhage of left orbit: Secondary | ICD-10-CM

## 2020-09-03 DIAGNOSIS — S0083XA Contusion of other part of head, initial encounter: Secondary | ICD-10-CM

## 2020-09-03 DIAGNOSIS — S0993XA Unspecified injury of face, initial encounter: Secondary | ICD-10-CM | POA: Diagnosis not present

## 2020-09-03 DIAGNOSIS — S0592XA Unspecified injury of left eye and orbit, initial encounter: Secondary | ICD-10-CM | POA: Diagnosis present

## 2020-09-03 DIAGNOSIS — I10 Essential (primary) hypertension: Secondary | ICD-10-CM | POA: Insufficient documentation

## 2020-09-03 DIAGNOSIS — R42 Dizziness and giddiness: Secondary | ICD-10-CM | POA: Diagnosis not present

## 2020-09-03 DIAGNOSIS — S0012XA Contusion of left eyelid and periocular area, initial encounter: Secondary | ICD-10-CM | POA: Diagnosis not present

## 2020-09-03 DIAGNOSIS — W101XXA Fall (on)(from) sidewalk curb, initial encounter: Secondary | ICD-10-CM | POA: Diagnosis not present

## 2020-09-03 DIAGNOSIS — Z043 Encounter for examination and observation following other accident: Secondary | ICD-10-CM | POA: Diagnosis not present

## 2020-09-03 DIAGNOSIS — S0512XA Contusion of eyeball and orbital tissues, left eye, initial encounter: Secondary | ICD-10-CM | POA: Diagnosis not present

## 2020-09-03 DIAGNOSIS — M25562 Pain in left knee: Secondary | ICD-10-CM | POA: Diagnosis not present

## 2020-09-03 NOTE — ED Triage Notes (Signed)
Pt to ED via POV with c/o L eye injury after mechanical fall. Pt with noted swelling around the L eyebrow at this time. Pt denies LOC, denies blood thinners at this time

## 2020-09-03 NOTE — ED Provider Notes (Signed)
[facility] Emergency Department Provider Note   ____________________________________________   Event Date/Time   First MD Initiated Contact with Patient 09/03/20 1331     (approximate)  I have reviewed the triage vital signs and the nursing notes.   HISTORY  Chief Complaint Fall    HPI Michelle Lutz is a 80 y.o. female with past medical history of hypertension, GERD, and osteoarthritis who presents to the ED following fall.  Patient reports that she was getting out of the car when she tripped on the curb, falling forward and striking the left side of her head.  She reports feeling woozy after hitting her head but did not lose consciousness.  She does not take any blood thinners.  She has noticed significant pain and swelling around her left eye, also complains of pain to the left knee.  She denies any numbness or weakness in her extremities, has a hard time seeing due to left eye swelling but denies any blurry vision when eye is opened.        Past Medical History:  Diagnosis Date  . Allergy   . Arthritis   . Cataract   . GERD (gastroesophageal reflux disease)   . Hypertension   . Osteoarthritis   . Sleep apnea   . Spinal stenosis     Patient Active Problem List   Diagnosis Date Noted  . Diarrhea of presumed infectious origin 08/22/2020  . Knee pain 03/09/2019  . Low back pain 03/09/2019  . 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 History of arthritis 10/07/2018  . Lumbar radiculopathy 10/07/2018  . History of total knee arthroplasty, right 10/07/2018  . Personal history of gastric ulcer 10/07/2018  . Constipation 01/13/2018  . Vasovagal near syncope 06/23/2017  . Edema 04/13/2017  . Lumbar neuritis 05/27/2016  . 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 Benign essential hypertension 04/09/2016  . Chronic rhinitis 04/09/2016  . Colon polyps 04/09/2016  . Cyst of left kidney 04/09/2016  . Osteoarthritis 04/09/2016   . Osteopenia 04/09/2016  . Urticaria 04/09/2016  . GI bleed 01/26/2013  . Hyperlipidemia 01/21/2006  . Sleep apnea 07/16/2000    Past Surgical History:  Procedure Laterality Date  . BREAST BIOPSY     20+ years  . BREAST CYST ASPIRATION     20+ years  . BREAST CYST EXCISION    . BREAST SURGERY    . CESAREAN SECTION    . EYE SURGERY    . FOOT SURGERY    . JOINT REPLACEMENT Right 07/07/2016   Partial R. knee in 2008 then complete in 2018  . uterine prolapse repair     Tacking uterus    Prior to Admission medications   Medication Sig Start Date End Date Taking? Authorizing Provider  Biotin 10 MG CAPS Take 10 mg by mouth daily.     [provider]  Calcium Carb-Cholecalciferol (CALCIUM CARBONATE-VITAMIN D3) 600-400 MG-UNIT TABS Take 1 tablet by mouth 2 (two) times daily.    [provider]  carboxymethylcellulose (REFRESH PLUS) 0.5 % SOLN Place 1 drop into both eyes 4 (four) times daily as needed (dry eyes).     [provider]  cetirizine (ZYRTEC) 10 MG tablet Take 10 mg by mouth daily.    [provider]  Cholecalciferol 25 MCG (1000 UT) tablet Take 1,000 Units by mouth daily.     [provider]  Cranberry 450 MG TABS Take 450 mg by mouth daily.  [provider]  diphenoxylate-atropine (LOMOTIL) 2.5-0.025 MG tablet Take 1 tablet by mouth 4 (four) times daily as needed for diarrhea or loose stools.    [provider]  doxazosin (CARDURA) 2 MG tablet Take 1 tablet by mouth once daily 05/14/20   Jerrol Banana., MD  fluticasone [facility]) 50 MCG/ACT nasal spray Place 2 sprays into both nostrils daily. 12/12/19   Jerrol Banana., MD  gabapentin (NEURONTIN) 100 MG capsule Take 1 capsule (100 mg total) by mouth 3 (three) times daily. 12/12/19   Jerrol Banana., MD  hydrochlorothiazide (MICROZIDE) 12.5 MG capsule Take 1 capsule (12.5 mg total) by mouth daily. 08/09/19   Jerrol Banana., MD   HYDROcodone-acetaminophen (NORCO) 7.5-325 MG tablet Take 1 tablet by mouth every 6 (six) hours as needed for moderate pain or severe pain.     [provider]  ipratropium (ATROVENT) 0.06 % nasal spray Place 2 sprays into both nostrils 2 (two) times daily. 02/14/20   Jerrol Banana., MD  irbesartan (AVAPRO) 300 MG tablet Take 1 tablet by mouth once daily 06/14/20   Jerrol Banana., MD  metroNIDAZOLE (METROCREAM) 0.75 % cream Apply 1 application topically 2 (two) times daily.    [provider]  montelukast (SINGULAIR) 10 MG tablet Take 1 tablet (10 mg total) by mouth at bedtime. 10/18/19   Jerrol Banana., MD  Multiple Vitamin (MULTIVITAMIN) capsule Take 1 capsule by mouth daily.    [provider]  omeprazole (PRILOSEC) 20 MG capsule Take 20 mg by mouth daily.    [provider]  pravastatin (PRAVACHOL) 40 MG tablet Take 1 tablet (40 mg total) by mouth daily. 02/14/20   Jerrol Banana., MD  Probiotic Product (ALIGN) 4 MG CAPS Take 4 mg by mouth daily.     [provider]  sodium chloride (MURO 128) 5 % ophthalmic ointment Place 1 application into both eyes as needed for eye irritation.     [provider]  traMADol (ULTRAM) 50 MG tablet Take 1 tablet (50 mg total) by mouth every 6 (six) hours as needed for moderate pain. 12/15/19   Jerrol Banana., MD  zolpidem (AMBIEN) 5 MG tablet Take 5 mg by mouth at bedtime as needed for sleep.    [provider]    Allergies Aspirin, Benazepril, and Zocor [simvastatin]  Family History  Problem Relation Age of Onset  . Heart disease Mother   . Kidney disease Mother   . Macular degeneration Mother   . Arthritis Father   . Heart disease Father   . COPD Father   . Arthritis Brother   . Heart disease Brother   . Hypertension Brother   . Breast cancer Neg Hx     Social History Social History   Tobacco Use  . Smoking status: Never Smoker  . Smokeless  tobacco: Never Used  Substance Use Topics  . Alcohol use: Not Currently  . Drug use: Yes    Comment: prescribed hydrocodone    Review of Systems  Constitutional: No fever/chills Eyes: No visual changes. ENT: No sore throat.  Positive for facial pain. Cardiovascular: Denies chest pain. Respiratory: Denies shortness of breath. Gastrointestinal: No abdominal pain.  No nausea, no vomiting.  No diarrhea.  No constipation. Genitourinary: Negative for dysuria. Musculoskeletal: Negative for back pain.  Positive for left knee pain. Skin: Negative for rash. Neurological: Negative for headaches, focal weakness or numbness.  ____________________________________________  PHYSICAL EXAM:  VITAL SIGNS: ED Triage Vitals  Enc Vitals Group     BP 09/03/20 1305 (!) 172/72     Pulse Rate 09/03/20 1305 77     Resp 09/03/20 1305 20     Temp 09/03/20 1305 98.4 F (36.9 C)     Temp Source 09/03/20 1305 Oral     SpO2 09/03/20 1305 96 %     Weight 09/03/20 1306 175 lb (79.4 kg)     Height 09/03/20 1306 5\' 4"  (1.626 m)     Head Circumference --      Peak Flow --      Pain Score 09/03/20 1306 6     Pain Loc --      Pain Edu? --      Excl. in [place]? --     Constitutional: Alert and oriented. Eyes: Conjunctivae are normal.  Pupils equal round and reactive to light bilaterally. Head: Left periorbital ecchymosis and edema with associated tenderness.  No maxillary or mandibular tenderness to palpation. Nose: No congestion/rhinnorhea. Mouth/Throat: Mucous membranes are moist. Neck: Normal ROM Cardiovascular: Normal rate, regular rhythm. Grossly normal heart sounds. Respiratory: Normal respiratory effort.  No retractions. Lungs CTAB. Gastrointestinal: Soft and nontender. No distention. Genitourinary: deferred Musculoskeletal: Diffuse tenderness to left knee with mild edema, no obvious deformity. Neurologic:  Normal speech and language. No gross focal neurologic deficits are appreciated. Skin:   Skin is warm, dry and intact. No rash noted. Psychiatric: Mood and affect are normal. Speech and behavior are normal.  ____________________________________________   LABS (all labs ordered are listed, but only abnormal results are displayed)  Labs Reviewed - No data to display   PROCEDURES  Procedure(s) performed (including Critical Care):  Procedures   ____________________________________________   INITIAL IMPRESSION / ASSESSMENT AND PLAN / ED COURSE       80 year old female with past medical history of hypertension, GERD, and osteoarthritis who presents to the ED complaining of left eye pain and swelling after tripping on a curb and striking her head.  We will further assess with CT head, cervical spine, and maxillofacial.  We will also check x-rays of left knee given pain and swelling.  No evidence of injury to her trunk and she has no focal neurologic deficits on exam.  X-rays of left knee reviewed by me and are negative for fracture or dislocation.  CT head, C-spine, and maxillofacial show left periorbital hematoma but no fracture or hemorrhage.  Patient is appropriate for discharge home with PCP follow-up, was counseled to take Tylenol as needed for pain and to return to the ED for new worsening symptoms.  Patient agrees with plan.      ____________________________________________   FINAL CLINICAL IMPRESSION(S) / ED DIAGNOSES  Final diagnoses:  Fall, initial encounter  Contusion of face, initial encounter  Periorbital hematoma of left eye     ED Discharge Orders    None       Note:  This document was prepared using Dragon voice recognition software and may include unintentional dictation errors.   Blake Divine, MD 09/03/20 [phone]

## 2020-09-03 NOTE — ED Notes (Signed)
See triage note  Presents s/p fall  States she tripped on curb when getting out of car   Small abrasion noted to lateral left knee  Bruising and swelling noted to left eye

## 2020-09-04 ENCOUNTER — Other Ambulatory Visit: Payer: Self-pay | Admitting: Family Medicine

## 2020-09-04 DIAGNOSIS — I10 Essential (primary) hypertension: Secondary | ICD-10-CM

## 2020-09-11 ENCOUNTER — Encounter: Payer: Self-pay | Admitting: Family Medicine

## 2020-09-11 ENCOUNTER — Other Ambulatory Visit: Payer: Self-pay

## 2020-09-11 ENCOUNTER — Ambulatory Visit (INDEPENDENT_AMBULATORY_CARE_PROVIDER_SITE_OTHER): Payer: Medicare Other | Admitting: Family Medicine

## 2020-09-11 VITALS — BP 162/67 | HR 68 | Resp 16 | Ht 64.0 in | Wt 180.0 lb

## 2020-09-11 DIAGNOSIS — I10 Essential (primary) hypertension: Secondary | ICD-10-CM

## 2020-09-11 DIAGNOSIS — E782 Mixed hyperlipidemia: Secondary | ICD-10-CM

## 2020-09-11 DIAGNOSIS — M4722 Other spondylosis with radiculopathy, cervical region: Secondary | ICD-10-CM | POA: Diagnosis not present

## 2020-09-11 DIAGNOSIS — M4726 Other spondylosis with radiculopathy, lumbar region: Secondary | ICD-10-CM

## 2020-09-11 DIAGNOSIS — W19XXXA Unspecified fall, initial encounter: Secondary | ICD-10-CM

## 2020-09-11 NOTE — Progress Notes (Signed)
Established patient visit   Patient: Michelle Lutz   DOB: 1940/10/03   80 y.o. Female  MRN: [id number] Visit Date: 09/11/2020  Today's healthcare provider: Wilhemena Durie, MD   Chief Complaint  Patient presents with  . ER follow up    Subjective    HPI  Patient tripped and fell and was seen in the ED.  No syncope or presyncope.  Injuries are much better. Hematoma of the thigh and bruising of the knee are resolving nicely. Follow up ER visit  Patient was seen in ER for fall on 09/03/2020. She was treated for fall. Treatment for this included all xrays were WNL. She reports good compliance with treatment. She reports this condition is Improved.      Medications: Outpatient Medications Prior to Visit  Medication Sig  . Biotin 10 MG CAPS Take 10 mg by mouth daily.   . Calcium Carb-Cholecalciferol (CALCIUM CARBONATE-VITAMIN D3) 600-400 MG-UNIT TABS Take 1 tablet by mouth 2 (two) times daily.  . carboxymethylcellulose (REFRESH PLUS) 0.5 % SOLN Place 1 drop into both eyes 4 (four) times daily as needed (dry eyes).   . cetirizine (ZYRTEC) 10 MG tablet Take 10 mg by mouth daily.  . Cholecalciferol 25 MCG (1000 UT) tablet Take 1,000 Units by mouth daily.   . Cranberry 450 MG TABS Take 450 mg by mouth daily.  . diphenoxylate-atropine (LOMOTIL) 2.5-0.025 MG tablet Take 1 tablet by mouth 4 (four) times daily as needed for diarrhea or loose stools.  . doxazosin (CARDURA) 2 MG tablet Take 1 tablet by mouth once daily  . fluticasone (FLONASE) 50 MCG/ACT nasal spray Place 2 sprays into both nostrils daily.  Marland Kitchen gabapentin (NEURONTIN) 100 MG capsule Take 1 capsule (100 mg total) by mouth 3 (three) times daily.  . hydrochlorothiazide (MICROZIDE) 12.5 MG capsule Take 1 capsule by mouth once daily  . HYDROcodone-acetaminophen (NORCO) 7.5-325 MG tablet Take 1 tablet by mouth every 6 (six) hours as needed for moderate pain or severe pain.   Marland Kitchen ipratropium (ATROVENT) 0.06 % nasal spray Place  2 sprays into both nostrils 2 (two) times daily.  . irbesartan (AVAPRO) 300 MG tablet Take 1 tablet by mouth once daily  . montelukast (SINGULAIR) 10 MG tablet Take 1 tablet (10 mg total) by mouth at bedtime.  . Multiple Vitamin (MULTIVITAMIN) capsule Take 1 capsule by mouth daily.  Marland Kitchen omeprazole (PRILOSEC) 20 MG capsule Take 20 mg by mouth daily.  . pravastatin (PRAVACHOL) 40 MG tablet Take 1 tablet (40 mg total) by mouth daily.  . metroNIDAZOLE (METROCREAM) 0.75 % cream Apply 1 application topically 2 (two) times daily. (Patient not taking: Reported on 09/11/2020)  . Probiotic Product (ALIGN) 4 MG CAPS Take 4 mg by mouth daily.   . sodium chloride (MURO 128) 5 % ophthalmic ointment Place 1 application into both eyes as needed for eye irritation.   . traMADol (ULTRAM) 50 MG tablet Take 1 tablet (50 mg total) by mouth every 6 (six) hours as needed for moderate pain.  Marland Kitchen zolpidem (AMBIEN) 5 MG tablet Take 5 mg by mouth at bedtime as needed for sleep.   No facility-administered medications prior to visit.    Review of Systems  Constitutional: Negative.   Respiratory: Negative for cough and shortness of breath.   Cardiovascular: Negative for chest pain, palpitations and leg swelling.  Musculoskeletal: Positive for arthralgias and myalgias.  Hematological: Bruises/bleeds easily.        Objective    BP Marland Kitchen)  162/67   Pulse 68   Resp 16   Ht 5\' 4"  (1.626 m)   Wt 180 lb (81.6 kg)   BMI 30.90 kg/m  BP Readings from Last 3 Encounters:  09/11/20 (!) 162/67  09/03/20 (!) 172/72  08/13/20 (!) 146/79   Wt Readings from Last 3 Encounters:  09/11/20 180 lb (81.6 kg)  09/03/20 175 lb (79.4 kg)  08/13/20 178 lb (80.7 kg)       Physical Exam Vitals reviewed.  HENT:     Head: Normocephalic and atraumatic.     Right Ear: External ear normal.     Left Ear: External ear normal.  Eyes:     General: No scleral icterus.    Conjunctiva/sclera: Conjunctivae normal.  Neck:     Vascular: No  carotid bruit.  Cardiovascular:     Rate and Rhythm: Normal rate and regular rhythm.     Heart sounds: Normal heart sounds.  Pulmonary:     Effort: Pulmonary effort is normal.     Breath sounds: Normal breath sounds.  Chest:  Breasts:     Right: Normal.     Left: Normal.    Abdominal:     Palpations: Abdomen is soft.  Lymphadenopathy:     Cervical: No cervical adenopathy.  Skin:    General: Skin is warm and dry.  Neurological:     General: No focal deficit present.     Mental Status: She is alert and oriented to person, place, and time.  Psychiatric:        Mood and Affect: Mood normal.        Behavior: Behavior normal.        Thought Content: Thought content normal.        Judgment: Judgment normal.       No results found for any visits on 09/11/20.  Assessment & Plan     1. Fall, initial encounter Contusion of eye and knee are much improved.  He is moving around well.  2. Essential hypertension Elevated blood pressure in office today but at home her readings are 120s to 130s over 70s. Hagan hypertension  3. Osteoarthritis of spine with radiculopathy, lumbar region   4. Osteoarthritis of spine with radiculopathy, cervical region   5. Mixed hyperlipidemia    No follow-ups on file.      I, Wilhemena Durie, MD, have reviewed all documentation for this visit. The documentation on 09/17/20 for the exam, diagnosis, procedures, and orders are all accurate and complete.    Dolton Shaker Cranford Mon, MD  [facility] [phone] (phone) [phone] (fax)  [place]

## 2020-09-12 ENCOUNTER — Ambulatory Visit: Payer: Medicare Other | Admitting: Dermatology

## 2020-09-13 DIAGNOSIS — R69 Illness, unspecified: Secondary | ICD-10-CM | POA: Diagnosis not present

## 2020-09-18 ENCOUNTER — Other Ambulatory Visit: Payer: Self-pay | Admitting: Family Medicine

## 2020-09-18 NOTE — Telephone Encounter (Signed)
Requested Prescriptions  Pending Prescriptions Disp Refills  . gabapentin (NEURONTIN) 100 MG capsule [Pharmacy Med Name: Gabapentin 100 MG Oral Capsule] 90 capsule 0    Sig: TAKE 1 CAPSULE BY MOUTH THREE TIMES DAILY     Neurology: Anticonvulsants - gabapentin Passed - 09/18/2020  6:12 PM      Passed - Valid encounter within last 12 months    Recent Outpatient Visits          1 week ago Fall, initial encounter   [facility] Jerrol Banana., MD   3 weeks ago Diarrhea of presumed infectious origin   [facility] Flinchum, Kelby Aline, FNP   1 month ago Essential hypertension   [facility] Jerrol Banana., MD   7 months ago Medicare annual wellness visit, subsequent   [facility] Jerrol Banana., MD   9 months ago Essential hypertension   [facility] Jerrol Banana., MD      Future Appointments            In 2 months Gollan, Kathlene November, MD [facility], [facility]   In 3 months Jerrol Banana., MD [facility], [place]

## 2020-09-26 ENCOUNTER — Encounter: Payer: Self-pay | Admitting: Dermatology

## 2020-09-26 ENCOUNTER — Other Ambulatory Visit: Payer: Self-pay

## 2020-09-26 ENCOUNTER — Ambulatory Visit: Payer: Medicare Other | Admitting: Dermatology

## 2020-09-26 DIAGNOSIS — L578 Other skin changes due to chronic exposure to nonionizing radiation: Secondary | ICD-10-CM | POA: Diagnosis not present

## 2020-09-26 DIAGNOSIS — Z85828 Personal history of other malignant neoplasm of skin: Secondary | ICD-10-CM

## 2020-09-26 DIAGNOSIS — L719 Rosacea, unspecified: Secondary | ICD-10-CM

## 2020-09-26 DIAGNOSIS — D229 Melanocytic nevi, unspecified: Secondary | ICD-10-CM

## 2020-09-26 DIAGNOSIS — Z1283 Encounter for screening for malignant neoplasm of skin: Secondary | ICD-10-CM

## 2020-09-26 DIAGNOSIS — L821 Other seborrheic keratosis: Secondary | ICD-10-CM

## 2020-09-26 DIAGNOSIS — D1800 Hemangioma unspecified site: Secondary | ICD-10-CM

## 2020-09-26 DIAGNOSIS — L814 Other melanin hyperpigmentation: Secondary | ICD-10-CM

## 2020-09-26 NOTE — Progress Notes (Signed)
   Follow-Up Visit   Subjective  Michelle Lutz is a 80 y.o. female who presents for the following: Annual Exam (Mole check ). Hx of BCC  The patient presents for Total-Body Skin Exam (TBSE) for skin cancer screening and mole check.  The following portions of the chart were reviewed this encounter and updated as appropriate:   Tobacco  Allergies  Meds  Problems  Med Hx  Surg Hx  Fam Hx     Review of Systems:  No other skin or systemic complaints except as noted in HPI or Assessment and Plan.  Objective  Well appearing patient in no apparent distress; mood and affect are within normal limits.  A full examination was performed including scalp, head, eyes, ears, nose, lips, neck, chest, axillae, abdomen, back, buttocks, bilateral upper extremities, bilateral lower extremities, hands, feet, fingers, toes, fingernails, and toenails. All findings within normal limits unless otherwise noted below.  Objective  multiple see history: Well healed scar with no evidence of recurrence.   Objective  Head - Anterior (Face): Mid face erythema with telangiectasias +/- scattered inflammatory papules.    Assessment & Plan  History of basal cell carcinoma (BCC) multiple see history  Clear. Observe for recurrence. Call clinic for new or changing lesions.  Recommend regular skin exams, daily broad-spectrum spf 30+ sunscreen use, and photoprotection.     Rosacea Head - Anterior (Face)  Rosacea is a chronic progressive skin condition usually affecting the face of adults, causing redness and/or acne bumps. It is treatable but not curable. It sometimes affects the eyes (ocular rosacea) as well. It may respond to topical and/or systemic medication and can flare with stress, sun exposure, alcohol, exercise and some foods.  Daily application of broad spectrum spf 30+ sunscreen to face is recommended to reduce flares.   Start Skin medicinals triple cream    Lentigines - Scattered tan macules - Due to  sun exposure - Benign-appering, observe - Recommend daily broad spectrum sunscreen SPF 30+ to sun-exposed areas, reapply every 2 hours as needed. - Call for any changes  Seborrheic Keratoses - Stuck-on, waxy, tan-brown papules and/or plaques  - Benign-appearing - Discussed benign etiology and prognosis. - Observe - Call for any changes  Melanocytic Nevi - Tan-brown and/or pink-flesh-colored symmetric macules and papules - Benign appearing on exam today - Observation - Call clinic for new or changing moles - Recommend daily use of broad spectrum spf 30+ sunscreen to sun-exposed areas.   Hemangiomas - Red papules - Discussed benign nature - Observe - Call for any changes  Actinic Damage - Chronic condition, secondary to cumulative UV/sun exposure - diffuse scaly erythematous macules with underlying dyspigmentation - Recommend daily broad spectrum sunscreen SPF 30+ to sun-exposed areas, reapply every 2 hours as needed.  - Staying in the shade or wearing long sleeves, sun glasses (UVA+UVB protection) and wide brim hats (4-inch brim around the entire circumference of the hat) are also recommended for sun protection.  - Call for new or changing lesions.  Skin cancer screening performed today.  Return in about 1 year (around 09/26/2021) for TBSE .  IMarye Round, CMA, am acting as scribe for Sarina Ser, MD .  Documentation: I have reviewed the above documentation for accuracy and completeness, and I agree with the above.  Sarina Ser, MD

## 2020-09-26 NOTE — Patient Instructions (Addendum)
If you have any questions or concerns for your doctor, please call our main line at [phone] and press option 4 to reach your doctor's medical assistant. If no one answers, please leave a voicemail as directed and we will return your call as soon as possible. Messages left after 4 pm will be answered the following business day.   You may also send us a message via MyChart. We typically respond to MyChart messages within 1-2 business days.  For prescription refills, please ask your pharmacy to contact our office. Our fax number is [phone].  If you have an urgent issue when the clinic is closed that cannot wait until the next business day, you can page your doctor at the number below.    Please note that while we do our best to be available for urgent issues outside of office hours, we are not available 24/7.   If you have an urgent issue and are unable to reach us, you may choose to seek medical care at your doctor's office, retail clinic, urgent care center, or emergency room.  If you have a medical emergency, please immediately call 911 or go to the emergency department.  Pager Numbers  - Dr. Kowalski: [phone]  - Dr. Moye: [phone]  - Dr. Stewart: [phone]  In the event of inclement weather, please call our main line at [phone] for an update on the status of any delays or closures.  Dermatology Medication Tips: Please keep the boxes that topical medications come in in order to help keep track of the instructions about where and how to use these. Pharmacies typically print the medication instructions only on the boxes and not directly on the medication tubes.   If your medication is too expensive, please contact our office at [phone] option 4 or send us a message through MyChart.   We are unable to tell what your co-pay for medications will be in advance as this is different depending on your insurance coverage. However, we may be able to find a substitute  medication at lower cost or fill out paperwork to get insurance to cover a needed medication.   If a prior authorization is required to get your medication covered by your insurance company, please allow us 1-2 business days to complete this process.  Drug prices often vary depending on where the prescription is filled and some pharmacies may offer cheaper prices.  The website www.goodrx.com contains coupons for medications through different pharmacies. The prices here do not account for what the cost may be with help from insurance (it may be cheaper with your insurance), but the website can give you the price if you did not use any insurance.  - You can print the associated coupon and take it with your prescription to the pharmacy.  - You may also stop by our office during regular business hours and pick up a GoodRx coupon card.  - If you need your prescription sent electronically to a different pharmacy, notify our office through [facility] MyChart or by phone at [phone] option 4.   Melanoma ABCDEs  Melanoma is the most dangerous type of skin cancer, and is the leading cause of death from skin disease.  You are more likely to develop melanoma if you:  Have light-colored skin, light-colored eyes, or red or blond hair  Spend a lot of time in the sun  Tan regularly, either outdoors or in a tanning bed  Have had blistering sunburns, especially during childhood  Have a close   family member who has had a melanoma  Have atypical moles or large birthmarks  Early detection of melanoma is key since treatment is typically straightforward and cure rates are extremely high if we catch it early.   The first sign of melanoma is often a change in a mole or a new dark spot.  The ABCDE system is a way of remembering the signs of melanoma.  A for asymmetry:  The two halves do not match. B for border:  The edges of the growth are irregular. C for color:  A mixture of colors are present instead  of an even brown color. D for diameter:  Melanomas are usually (but not always) greater than 6mm - the size of a pencil eraser. E for evolution:  The spot keeps changing in size, shape, and color.  Please check your skin once per month between visits. You can use a small mirror in front and a large mirror behind you to keep an eye on the back side or your body.   If you see any new or changing lesions before your next follow-up, please call to schedule a visit.  Please continue daily skin protection including broad spectrum sunscreen SPF 30+ to sun-exposed areas, reapplying every 2 hours as needed when you're outdoors.   

## 2020-09-29 ENCOUNTER — Encounter: Payer: Self-pay | Admitting: Dermatology

## 2020-10-16 ENCOUNTER — Other Ambulatory Visit: Payer: Self-pay

## 2020-10-16 ENCOUNTER — Ambulatory Visit (INDEPENDENT_AMBULATORY_CARE_PROVIDER_SITE_OTHER): Payer: Medicare Other | Admitting: Family Medicine

## 2020-10-16 ENCOUNTER — Ambulatory Visit: Payer: Self-pay | Admitting: *Deleted

## 2020-10-16 ENCOUNTER — Ambulatory Visit
Admission: RE | Admit: 2020-10-16 | Discharge: 2020-10-16 | Disposition: A | Discharge status: Home / Self Care | Payer: Medicare Other | Attending: Family Medicine | Admitting: Family Medicine

## 2020-10-16 ENCOUNTER — Ambulatory Visit
Admission: RE | Admit: 2020-10-16 | Discharge: 2020-10-16 | Disposition: A | Discharge status: Home / Self Care | Payer: Medicare Other | Source: Ambulatory Visit | Attending: Family Medicine | Admitting: Family Medicine

## 2020-10-16 ENCOUNTER — Encounter: Payer: Self-pay | Admitting: Family Medicine

## 2020-10-16 VITALS — BP 95/51 | HR 79 | Temp 100.6°F | Wt 175.0 lb

## 2020-10-16 DIAGNOSIS — R059 Cough, unspecified: Secondary | ICD-10-CM | POA: Insufficient documentation

## 2020-10-16 DIAGNOSIS — R509 Fever, unspecified: Secondary | ICD-10-CM

## 2020-10-16 MED ORDER — BENZONATATE 100 MG PO CAPS: 100.0000 mg | ORAL_CAPSULE | Freq: Two times a day (BID) | ORAL | 0 refills | Status: DC | PRN

## 2020-10-16 NOTE — Telephone Encounter (Signed)
Noted  

## 2020-10-16 NOTE — Progress Notes (Signed)
MyChart Video Visit    Virtual Visit via Video Note   This visit type was conducted due to national recommendations for restrictions regarding the COVID-19 Pandemic (e.g. social distancing) in an effort to limit this patient's exposure and mitigate transmission in our community. This patient is at least at moderate risk for complications without adequate follow up. This format is felt to be most appropriate for this patient at this time. Physical exam was limited by quality of the video and audio technology used for the visit.    Patient location: home Provider location: [place] involved in the visit: patient, provider  I discussed the limitations of evaluation and management by telemedicine and the availability of in person appointments. The patient expressed understanding and agreed to proceed.  Patient: Michelle Lutz   DOB: March 15, 1941   80 y.o. Female  MRN: [id number] Visit Date: 10/16/2020  Today's healthcare provider: Lavon Paganini, MD   Chief Complaint  Patient presents with  . URI   Subjective    HPI HPI    URI    URI symptoms: congestion, cough, headache, rhinorrhea and sore throat   Onset: in the past 7 days   Progression since onset: gradually worsening since onset   Fever: maximum temperature recorded prior to his arrival was 100.4-100.9 F   Hydration: is drinking plenty of fluids   PMH includes: no history of pneumonia or bronchitis   Smoker: not a smoker          Comments    Patient reports her husband had COVID last week, she has tested negative. Patient did start OTC zinc and vitamin C last week. Patient reports cough is worsening and keep her up at night. Patient reports taking Robitussin, reports no cough control.        Last edited by Dorian Pod, CMA on 10/16/2020  9:48 AM. (History)    Tested negative x4 since last week. T100.5 this AM, no fever before that No SOB Cough is productive of yellow  sputum. Mild headache, sore throat Biggest concern is the cough that is keeping her up at night Low BP this week - has h/o vasovagal near syncope.  Patient Active Problem List   Diagnosis Date Noted  . Diarrhea of presumed infectious origin 08/22/2020  . Knee pain 03/09/2019  . Low back pain 03/09/2019  . 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 History of arthritis 10/07/2018  . Lumbar radiculopathy 10/07/2018  . History of total knee arthroplasty, right 10/07/2018  . Personal history of gastric ulcer 10/07/2018  . Constipation 01/13/2018  . Vasovagal near syncope 06/23/2017  . Edema 04/13/2017  . Lumbar neuritis 05/27/2016  . 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 Benign essential hypertension 04/09/2016  . Chronic rhinitis 04/09/2016  . Colon polyps 04/09/2016  . Cyst of left kidney 04/09/2016  . Osteoarthritis 04/09/2016  . Osteopenia 04/09/2016  . Urticaria 04/09/2016  . GI bleed 01/26/2013  . Hyperlipidemia 01/21/2006  . Sleep apnea 07/16/2000   Social History   Tobacco Use  . Smoking status: Never Smoker  . Smokeless tobacco: Never Used  Substance Use Topics  . Alcohol use: Not Currently  . Drug use: Yes    Comment: prescribed hydrocodone   Allergies  Allergen Reactions  . Aspirin   . Benazepril Cough  . Zocor [Simvastatin]     Caused pain      Medications: Outpatient Medications Prior to Visit  Medication Sig  . Biotin 10  MG CAPS Take 10 mg by mouth daily.   . Calcium Carb-Cholecalciferol (CALCIUM CARBONATE-VITAMIN D3) 600-400 MG-UNIT TABS Take 1 tablet by mouth 2 (two) times daily.  . carboxymethylcellulose (REFRESH PLUS) 0.5 % SOLN Place 1 drop into both eyes 4 (four) times daily as needed (dry eyes).   . cetirizine (ZYRTEC) 10 MG tablet Take 10 mg by mouth daily.  . Cholecalciferol 25 MCG (1000 UT) tablet Take 1,000 Units by mouth daily.   . Cranberry 450 MG TABS Take 450 mg by mouth daily.  . diphenoxylate-atropine  (LOMOTIL) 2.5-0.025 MG tablet Take 1 tablet by mouth 4 (four) times daily as needed for diarrhea or loose stools.  . doxazosin (CARDURA) 2 MG tablet Take 1 tablet by mouth once daily  . fluticasone (FLONASE) 50 MCG/ACT nasal spray Place 2 sprays into both nostrils daily.  Marland Kitchen gabapentin (NEURONTIN) 100 MG capsule TAKE 1 CAPSULE BY MOUTH THREE TIMES DAILY  . hydrochlorothiazide (MICROZIDE) 12.5 MG capsule Take 1 capsule by mouth once daily  . HYDROcodone-acetaminophen (NORCO) 7.5-325 MG tablet Take 1 tablet by mouth every 6 (six) hours as needed for moderate pain or severe pain.   Marland Kitchen ipratropium (ATROVENT) 0.06 % nasal spray Place 2 sprays into both nostrils 2 (two) times daily.  . irbesartan (AVAPRO) 300 MG tablet Take 1 tablet by mouth once daily  . metroNIDAZOLE (METROCREAM) 0.75 % cream Apply 1 application topically 2 (two) times daily.  . montelukast (SINGULAIR) 10 MG tablet Take 1 tablet (10 mg total) by mouth at bedtime.  . Multiple Vitamin (MULTIVITAMIN) capsule Take 1 capsule by mouth daily.  Marland Kitchen omeprazole (PRILOSEC) 20 MG capsule Take 20 mg by mouth daily.  . pravastatin (PRAVACHOL) 40 MG tablet Take 1 tablet (40 mg total) by mouth daily.  . Probiotic Product (ALIGN) 4 MG CAPS Take 4 mg by mouth daily.   . sodium chloride (MURO 128) 5 % ophthalmic ointment Place 1 application into both eyes as needed for eye irritation.   . traMADol (ULTRAM) 50 MG tablet Take 1 tablet (50 mg total) by mouth every 6 (six) hours as needed for moderate pain.  Marland Kitchen zolpidem (AMBIEN) 5 MG tablet Take 5 mg by mouth at bedtime as needed for sleep.   No facility-administered medications prior to visit.    Review of Systems  Constitutional: Positive for chills and fever. Negative for appetite change.  HENT: Positive for congestion, postnasal drip, rhinorrhea and sore throat. Negative for ear pain and sinus pressure.   Respiratory: Positive for cough. Negative for shortness of breath and wheezing.   Cardiovascular:  Negative for chest pain and palpitations.  Gastrointestinal: Negative for abdominal distention, nausea and vomiting.  Neurological: Positive for headaches.      Objective    BP (!) 95/51 (BP Location: Left Arm, Patient Position: Sitting, Cuff Size: Normal)   Pulse 79   Temp (!) 100.6 F (38.1 C) (Oral)   Wt 175 lb (79.4 kg)   BMI 30.04 kg/m  BP Readings from Last 3 Encounters:  10/16/20 (!) 95/51  09/11/20 (!) 162/67  09/03/20 (!) 172/72   Wt Readings from Last 3 Encounters:  10/16/20 175 lb (79.4 kg)  09/11/20 180 lb (81.6 kg)  09/03/20 175 lb (79.4 kg)      Physical Exam Constitutional:      General: She is not in acute distress.    Appearance: Normal appearance.  HENT:     Head: Normocephalic and atraumatic.  Pulmonary:     Effort: Pulmonary effort is  normal. No respiratory distress.  Neurological:     Mental Status: She is alert and oriented to person, place, and time. Mental status is at baseline.  Psychiatric:        Mood and Affect: Mood normal.        Assessment & Plan     1. Cough 2. Fever, unspecified fever cause - new problem - suspect viral etiology despite negative COVID test-discussed other viral processes - Given lingering cough that is nonproductive and new fever, get chest x-ray to rule out pneumonia - She is breathing comfortably and has no red flag symptoms - If chest x-ray shows pneumonia, will prescribe antibiotics - Discussed hydrating well and symptomatic management - Tessalon and honey as needed for cough - Return precautions discussed - DG Chest 2 View; Future   Meds ordered this encounter  Medications  . benzonatate (TESSALON) 100 MG capsule    Sig: Take 1 capsule (100 mg total) by mouth 2 (two) times daily as needed for cough.    Dispense:  20 capsule    Refill:  0     No follow-ups on file.     I discussed the assessment and treatment plan with the patient. The patient was provided an opportunity to ask questions and all  were answered. The patient agreed with the plan and demonstrated an understanding of the instructions.   The patient was advised to call back or seek an in-person evaluation if the symptoms worsen or if the condition fails to improve as anticipated.  I, Lavon Paganini, MD, have reviewed all documentation for this visit. The documentation on 10/16/20 for the exam, diagnosis, procedures, and orders are all accurate and complete.   Ayo Guarino, Dionne Bucy, MD, MPH [facility]

## 2020-10-16 NOTE — Telephone Encounter (Signed)
Summary: cough and headache   Patient was exposed to [place] on Monday/Tuesday 5/16   Patient has tested negative for COVID 4(four) times   Patient is currently experiencing a headache and cough that is causing them discomfort   Patient is currently experiencing an elevated temp of 100.5/100.6   Patient's blood pressure has dropped as low as 95/51      Call to patient- her husband was diagnosed with COVID and she has had exposure to him. Patient is having COVID symptoms but all home testing had been negative. Call to office- virtual appointment scheduled. Reason for Disposition . [1] HIGH RISK for severe COVID complications (e.g., weak immune system, age > 43 years, obesity with BMI > 25, pregnant, chronic lung disease or other chronic medical condition) AND [2] COVID symptoms (e.g., cough, fever)  (Exceptions: Already seen by PCP and no new or worsening symptoms.)  Answer Assessment - Initial Assessment Questions 1. COVID-19 EXPOSURE: "Please describe how you were exposed to someone with a COVID-19 infection."     5/16 2. PLACE of CONTACT: "Where were you when you were exposed to COVID-19?" (e.g., home, school, medical waiting room; which city?)     home 3. TYPE of CONTACT: "How much contact was there?" (e.g., sitting next to, live in same house, work in same office, same building)     Live in same house 4. DURATION of CONTACT: "How long were you in contact with the COVID-19 patient?" (e.g., a few seconds, passed by person, a few minutes, 15 minutes or longer, live with the patient)     Lives with patient 5. MASK: "Were you wearing a mask?" "Was the other person wearing a mask?" Note: wearing a mask reduces the risk of an otherwise close contact.     No mask 6. DATE of CONTACT: "When did you have contact with a COVID-19 patient?" (e.g., how many days ago)     5/16 7. COMMUNITY SPREAD: "Are there lots of cases of COVID-19 (community spread) where you live?" (See public health department  website, if unsure)       yes 8. SYMPTOMS: "Do you have any symptoms?" (e.g., fever, cough, breathing difficulty, loss of taste or smell)     Fever,cough, headache- slight, chest congeston 9. VACCINE: "Have you gotten the COVID-19 vaccine?" If Yes, ask: "Which one, how many shots, when did you get it?"     Yes- phizer 10. BOOSTER: "Have you received your COVID-19 booster?" If Yes, ask: "Which one and when did you get it?"       Yes-both- 5/12 11. PREGNANCY OR POSTPARTUM: "Is there any chance you are pregnant?" "When was your last menstrual period?" "Did you deliver in the last 2 weeks?"       n/a 12. HIGH RISK: "Do you have any heart or lung problems?" (e.g., asthma , COPD, heart failure) "Do you have a weak immune system or other risk factors?" (e.g., HIV positive, chemotherapy, renal failure, diabetes mellitus, sickle cell anemia, obesity)       age 80. TRAVEL: "Have you traveled out of the country recently?" If Yes, ask: "When and where?"  Note: Travel becomes less relevant if there is widespread community transmission where the patient lives.       no  Protocols used: CORONAVIRUS (COVID-19) DIAGNOSED OR SUSPECTED-A-AH, CORONAVIRUS (COVID-19) EXPOSURE-A-AH

## 2020-10-17 ENCOUNTER — Telehealth: Payer: Self-pay

## 2020-10-17 NOTE — Telephone Encounter (Signed)
Copied from [place] [phone]. Topic: General - Call Back - No Documentation >> Oct 17, 2020  2:50 PM Erick Blinks wrote: Reason for CRM: Pt wants to discuss her recent imaging, please advise    Best contact [phone]

## 2020-10-18 NOTE — Telephone Encounter (Signed)
Returned call to patient.

## 2020-10-25 DIAGNOSIS — H903 Sensorineural hearing loss, bilateral: Secondary | ICD-10-CM | POA: Diagnosis not present

## 2020-10-30 ENCOUNTER — Other Ambulatory Visit: Payer: Self-pay | Admitting: Family Medicine

## 2020-10-30 DIAGNOSIS — J310 Chronic rhinitis: Secondary | ICD-10-CM

## 2020-11-10 ENCOUNTER — Other Ambulatory Visit: Payer: Self-pay | Admitting: Family Medicine

## 2020-11-10 NOTE — Telephone Encounter (Signed)
Requested Prescriptions  Pending Prescriptions Disp Refills  . gabapentin (NEURONTIN) 100 MG capsule [Pharmacy Med Name: Gabapentin 100 MG Oral Capsule] 90 capsule 2    Sig: TAKE 1 CAPSULE BY MOUTH THREE TIMES DAILY     Neurology: Anticonvulsants - gabapentin Passed - 11/10/2020  8:47 AM      Passed - Valid encounter within last 12 months    Recent Outpatient Visits          3 weeks ago Cough   [facility] [place], Dionne Bucy, MD   2 months ago Fall, initial encounter   [facility] Jerrol Banana., MD   2 months ago Diarrhea of presumed infectious origin   [place], Kelby Aline, FNP   2 months ago Essential hypertension   [facility] Jerrol Banana., MD   9 months ago Medicare annual wellness visit, subsequent   [facility] Jerrol Banana., MD      Future Appointments            In 3 weeks Gollan, Kathlene November, MD [facility]   In 2 months Jerrol Banana., MD [facility], [place]

## 2020-12-05 ENCOUNTER — Encounter: Payer: Self-pay | Admitting: Cardiovascular Disease

## 2020-12-05 ENCOUNTER — Ambulatory Visit: Payer: Medicare Other | Admitting: Cardiovascular Disease

## 2020-12-05 ENCOUNTER — Other Ambulatory Visit: Payer: Self-pay

## 2020-12-05 VITALS — BP 160/72 | HR 66 | Ht 64.0 in | Wt 178.0 lb

## 2020-12-05 DIAGNOSIS — R55 Syncope and collapse: Secondary | ICD-10-CM | POA: Diagnosis not present

## 2020-12-05 DIAGNOSIS — I10 Essential (primary) hypertension: Secondary | ICD-10-CM | POA: Diagnosis not present

## 2020-12-05 DIAGNOSIS — E782 Mixed hyperlipidemia: Secondary | ICD-10-CM | POA: Diagnosis not present

## 2020-12-05 MED ORDER — DOXAZOSIN MESYLATE 2 MG PO TABS: 1.0000 mg | ORAL_TABLET | Freq: Two times a day (BID) | ORAL | 3 refills | Status: DC

## 2020-12-05 NOTE — Patient Instructions (Addendum)
Big glass of water first thing in the AM before coffee  Medication Instructions:  Please hold the HCTZ, take for BP >140 Please change the cardura/doxazosin to 1 mg twice a day (not 2 mg at once) Take half tab in the am and half tab in the pm  If you need a refill on your cardiac medications before your next appointment, please call your pharmacy.    Lab work: No new labs needed  Testing/Procedures: No new testing needed  Follow-Up: At [facility], you and your health needs are our priority.  As part of our continuing mission to provide you with exceptional heart care, we have created designated Provider Care Teams.  These Care Teams include your primary Cardiologist (physician) and Advanced Practice Providers (APPs -  Physician Assistants and Nurse Practitioners) who all work together to provide you with the care you need, when you need it.  You will need a follow up appointment in 12 months  Providers on your designated Care Team:   Murray Hodgkins, NP Christell Faith, PA-C Marrianne Mood, PA-C [place], [place]  COVID-19 Vaccine Information can be found at: [email or web address].uk For questions related to vaccine distribution or appointments, please email [email or web address] or call [phone].

## 2020-12-05 NOTE — Progress Notes (Signed)
Cardiology Office Note  Date:  12/05/2020   ID:  Michelle Lutz, DOB [birth year], MRN [id number]  PCP:  Jerrol Banana., MD   Chief Complaint  Patient presents with   6 month follow up     Patient c/o blood pressure fluctuating with several near fainting spells. Medications reviewed by the patient verbally.     HPI:  Michelle Lutz is a 80 year old woman with past medical history of Hypertension Spinal stenosis Hyperlipidemia OSA on CPAP Cath 2003: no CAD Syncope, vasovagal (better off verapamil) F/u of her hypertension, near syncope syncope  history of near syncope and syncope Couple episodes of near syncope in AM Some measurements systolic pressure 633, 354  Take meds in AM,  Near syncope before am meds Longstanding chronic issue, previously with syncope as detailed below  No regular exercise program but stays active  Does not drink much fluids Orthostatics done in the office today were negative, most of her pressures 150-1 60  EKG personally reviewed by myself on todays visit Normal sinus rhythm rate 63 bpm no significant ST-T wave changes  Past medical history reviewed Episode last week of near syncope, was cooking, 02/28/2019 was a "big one" Other episodes with dates below 02/08/18 10/21/17 Jan 2019 Reports that some of her episodes, systolic pressures in the 70s Symptoms have improved by holding verapamil, now does not have many episodes and not as severe    Lab Results  Component Value Date   CHOL 162 12/14/2019   HDL 61 12/14/2019   LDLCALC 87 12/14/2019   TRIG 72 12/14/2019    PMH:   has a past medical history of Allergy, Arthritis, Basal cell carcinoma, Cataract, GERD (gastroesophageal reflux disease), Hypertension, Osteoarthritis, Sleep apnea, and Spinal stenosis.  PSH:    Past Surgical History:  Procedure Laterality Date   BREAST BIOPSY     20+ years   BREAST CYST ASPIRATION     20+ years   BREAST CYST EXCISION     BREAST SURGERY      CESAREAN SECTION     EYE SURGERY     FOOT SURGERY     JOINT REPLACEMENT Right 07/07/2016   Partial R. knee in 2008 then complete in 2018   uterine prolapse repair     Tacking uterus    Current Outpatient Medications  Medication Sig Dispense Refill   benzonatate (TESSALON) 100 MG capsule Take 1 capsule (100 mg total) by mouth 2 (two) times daily as needed for cough. 20 capsule 0   Biotin 10 MG CAPS Take 10 mg by mouth daily.      Calcium Carb-Cholecalciferol (CALCIUM CARBONATE-VITAMIN D3) 600-400 MG-UNIT TABS Take 1 tablet by mouth 2 (two) times daily.     carboxymethylcellulose (REFRESH PLUS) 0.5 % SOLN Place 1 drop into both eyes 4 (four) times daily as needed (dry eyes).      cetirizine (ZYRTEC) 10 MG tablet Take 10 mg by mouth daily.     Cholecalciferol 25 MCG (1000 UT) tablet Take 1,000 Units by mouth daily.      Cranberry 450 MG TABS Take 450 mg by mouth daily.     diphenoxylate-atropine (LOMOTIL) 2.5-0.025 MG tablet Take 1 tablet by mouth 4 (four) times daily as needed for diarrhea or loose stools.     doxazosin (CARDURA) 2 MG tablet Take 1 tablet by mouth once daily 90 tablet 3   fluticasone (FLONASE) 50 MCG/ACT nasal spray Place 2 sprays into both nostrils daily. 18.2 mL 3  gabapentin (NEURONTIN) 100 MG capsule TAKE 1 CAPSULE BY MOUTH THREE TIMES DAILY 90 capsule 2   hydrochlorothiazide (MICROZIDE) 12.5 MG capsule Take 1 capsule by mouth once daily 90 capsule 0   HYDROcodone-acetaminophen (NORCO) 7.5-325 MG tablet Take 1 tablet by mouth every 6 (six) hours as needed for moderate pain or severe pain.      ipratropium (ATROVENT) 0.06 % nasal spray Place 2 sprays into both nostrils 2 (two) times daily. 15 mL 5   irbesartan (AVAPRO) 300 MG tablet Take 1 tablet by mouth once daily 90 tablet 0   metroNIDAZOLE (METROCREAM) 0.75 % cream Apply 1 application topically 2 (two) times daily.     montelukast (SINGULAIR) 10 MG tablet TAKE 1 TABLET BY MOUTH AT BEDTIME 90 tablet 0   Multiple  Vitamin (MULTIVITAMIN) capsule Take 1 capsule by mouth daily.     omeprazole (PRILOSEC) 20 MG capsule Take 20 mg by mouth daily.     pravastatin (PRAVACHOL) 40 MG tablet Take 1 tablet (40 mg total) by mouth daily. 30 tablet 11   Probiotic Product (ALIGN) 4 MG CAPS Take 4 mg by mouth daily.      sodium chloride (MURO 128) 5 % ophthalmic ointment Place 1 application into both eyes as needed for eye irritation.      traMADol (ULTRAM) 50 MG tablet Take 1 tablet (50 mg total) by mouth every 6 (six) hours as needed for moderate pain. 30 tablet 2   zolpidem (AMBIEN) 5 MG tablet Take 5 mg by mouth at bedtime as needed for sleep.     No current facility-administered medications for this visit.     Allergies:   Aspirin, Benazepril, and Zocor [simvastatin]   Social History:  The patient  reports that she has never smoked. She has never used smokeless tobacco.  No alcohol use . she reports current drug use.   Family History:   family history includes Arthritis in her brother and father; COPD in her father; Heart disease in her brother, father, and mother; Hypertension in her brother; Kidney disease in her mother; Macular degeneration in her mother.    Review of Systems: Review of Systems  Constitutional: Negative.   HENT: Negative.    Respiratory: Negative.    Cardiovascular: Negative.   Gastrointestinal: Negative.   Musculoskeletal: Negative.   Neurological:  Positive for dizziness.  Psychiatric/Behavioral: Negative.    All other systems reviewed and are negative.  PHYSICAL EXAM: VS:  BP (!) 160/72 (BP Location: Left Arm, Patient Position: Sitting, Cuff Size: Large)   Pulse 66   Ht 5\' 4"  (1.626 m)   Wt 178 lb (80.7 kg)   SpO2 98%   BMI 30.55 kg/m  , BMI Body mass index is 30.55 kg/m. Constitutional:  oriented to person, place, and time. No distress.  HENT:  Head: Grossly normal Eyes:  no discharge. No scleral icterus.  Neck: No JVD, no carotid bruits  Cardiovascular: Regular rate  and rhythm, no murmurs appreciated Pulmonary/Chest: Clear to auscultation bilaterally, no wheezes or rails Abdominal: Soft.  no distension.  no tenderness.  Musculoskeletal: Normal range of motion Neurological:  normal muscle tone. Coordination normal. No atrophy Skin: Skin warm and dry Psychiatric: normal affect, pleasant   Recent Labs: 12/14/2019: ALT 14; BUN 13; Creatinine, Ser 0.94; Potassium 4.5; Sodium 141; TSH 5.140 02/14/2020: Hemoglobin 12.6; Platelets 226    Lipid Panel Lab Results  Component Value Date   CHOL 162 12/14/2019   HDL 61 12/14/2019   LDLCALC 87 12/14/2019  TRIG 72 12/14/2019      Wt Readings from Last 3 Encounters:  12/05/20 178 lb (80.7 kg)  10/16/20 175 lb (79.4 kg)  09/11/20 180 lb (81.6 kg)       ASSESSMENT AND PLAN:  Problem List Items Addressed This Visit       Cardiology Problems   Benign essential hypertension   Hyperlipidemia   Other Visit Diagnoses     Syncope and collapse    -  Primary     Syncope/near syncope Long history previous work-up with cardiology Recommend she change doxazosin to 1 mg twice daily from 2 mg in evening Only take HCTZ for blood pressure over 140 Drink a large glass of water first thing in the morning before her hot coffee She knows to sit or lay down if she has near syncope symptoms Several of these episodes blood pressure was not particularly low when she was sitting in recovery  less likely arrhythmia  Essential hypertension Changes as above for near syncope  Hyperlipidemia Tolerating pravastatin No ischemic work-up needed at this time  Obstructive sleep apnea On CPAP    Total encounter time more than 25 minutes  Greater than 50% was spent in counseling and coordination of care with the patient    Signed, Esmond Plants, M.D., Ph.D. [place], [place]

## 2020-12-07 ENCOUNTER — Other Ambulatory Visit: Payer: Self-pay | Admitting: Family Medicine

## 2020-12-07 DIAGNOSIS — Z1231 Encounter for screening mammogram for malignant neoplasm of breast: Secondary | ICD-10-CM

## 2020-12-10 ENCOUNTER — Other Ambulatory Visit: Payer: Self-pay | Admitting: Family Medicine

## 2020-12-10 DIAGNOSIS — I10 Essential (primary) hypertension: Secondary | ICD-10-CM

## 2020-12-19 ENCOUNTER — Other Ambulatory Visit: Payer: Self-pay | Admitting: Family Medicine

## 2020-12-21 ENCOUNTER — Encounter: Payer: Self-pay | Admitting: Family Medicine

## 2020-12-21 ENCOUNTER — Other Ambulatory Visit: Payer: Self-pay

## 2020-12-21 ENCOUNTER — Ambulatory Visit (INDEPENDENT_AMBULATORY_CARE_PROVIDER_SITE_OTHER): Payer: Medicare Other | Admitting: Family Medicine

## 2020-12-21 VITALS — BP 134/71 | HR 81 | Temp 98.1°F | Resp 15 | Wt 176.5 lb

## 2020-12-21 DIAGNOSIS — H353132 Nonexudative age-related macular degeneration, bilateral, intermediate dry stage: Secondary | ICD-10-CM | POA: Diagnosis not present

## 2020-12-21 DIAGNOSIS — M1712 Unilateral primary osteoarthritis, left knee: Secondary | ICD-10-CM | POA: Diagnosis not present

## 2020-12-21 MED ORDER — METHYLPREDNISOLONE ACETATE 40 MG/ML IJ SUSP
40.0000 mg | Freq: Once | INTRAMUSCULAR | Status: AC
  Administered 2020-12-21: 40 mg via INTRAMUSCULAR

## 2020-12-21 NOTE — Addendum Note (Signed)
Addended by: Minette Headland on: 12/21/2020 10:31 AM   Modules accepted: Orders

## 2020-12-21 NOTE — Progress Notes (Signed)
Established patient visit   Patient: Michelle Lutz   DOB: [birth year]   80 y.o. Female  MRN: [id number] Visit Date: 12/21/2020  Today's healthcare provider: Lavon Paganini, MD   Chief Complaint  Patient presents with   Knee Pain   Subjective    Knee Pain  There was no injury mechanism. The pain is present in the left knee. The quality of the pain is described as stabbing. The pain has been Fluctuating since onset. Pertinent negatives include no inability to bear weight, loss of motion, loss of sensation, muscle weakness, numbness or tingling. The symptoms are aggravated by movement. She has tried NSAIDs and acetaminophen for the symptoms. The treatment provided mild relief.    Left Eye Soreness The patient states that three and a half months ago she had a fall that primarily injured her left eye. She states that she is still experiencing soreness in the area. She states that she is also experiencing nerve damage.       Medications: Outpatient Medications Prior to Visit  Medication Sig   Biotin 10 MG CAPS Take 10 mg by mouth daily.    Calcium Carb-Cholecalciferol (CALCIUM CARBONATE-VITAMIN D3) 600-400 MG-UNIT TABS Take 1 tablet by mouth 2 (two) times daily.   carboxymethylcellulose (REFRESH PLUS) 0.5 % SOLN Place 1 drop into both eyes 4 (four) times daily as needed (dry eyes).    cetirizine (ZYRTEC) 10 MG tablet Take 10 mg by mouth daily.   Cholecalciferol 25 MCG (1000 UT) tablet Take 1,000 Units by mouth daily.    Cranberry 450 MG TABS Take 450 mg by mouth daily.   diphenoxylate-atropine (LOMOTIL) 2.5-0.025 MG tablet Take 1 tablet by mouth 4 (four) times daily as needed for diarrhea or loose stools.   doxazosin (CARDURA) 2 MG tablet Take 0.5 tablets (1 mg total) by mouth 2 (two) times daily.   fluticasone (FLONASE) 50 MCG/ACT nasal spray Place 2 sprays into both nostrils daily.   gabapentin (NEURONTIN) 100 MG capsule TAKE 1 CAPSULE BY MOUTH THREE TIMES DAILY    hydrochlorothiazide (MICROZIDE) 12.5 MG capsule Take 1 capsule by mouth once daily   HYDROcodone-acetaminophen (NORCO) 7.5-325 MG tablet Take 1 tablet by mouth every 6 (six) hours as needed for moderate pain or severe pain.    ipratropium (ATROVENT) 0.06 % nasal spray Place 2 sprays into both nostrils 2 (two) times daily.   irbesartan (AVAPRO) 300 MG tablet Take 1 tablet by mouth once daily   metroNIDAZOLE (METROCREAM) 0.75 % cream Apply 1 application topically 2 (two) times daily.   montelukast (SINGULAIR) 10 MG tablet TAKE 1 TABLET BY MOUTH AT BEDTIME   Multiple Vitamin (MULTIVITAMIN) capsule Take 1 capsule by mouth daily.   omeprazole (PRILOSEC) 20 MG capsule Take 20 mg by mouth daily.   pravastatin (PRAVACHOL) 40 MG tablet Take 1 tablet (40 mg total) by mouth daily.   Probiotic Product (ALIGN) 4 MG CAPS Take 4 mg by mouth daily.    sodium chloride (MURO 128) 5 % ophthalmic ointment Place 1 application into both eyes as needed for eye irritation.    traMADol (ULTRAM) 50 MG tablet Take 1 tablet (50 mg total) by mouth every 6 (six) hours as needed for moderate pain.   zolpidem (AMBIEN) 5 MG tablet Take 5 mg by mouth at bedtime as needed for sleep.   benzonatate (TESSALON) 100 MG capsule Take 1 capsule (100 mg total) by mouth 2 (two) times daily as needed for cough. (Patient not taking: Reported  on 12/21/2020)   No facility-administered medications prior to visit.    Review of Systems  Constitutional:  Negative for chills, fatigue and fever.  HENT:  Negative for congestion, ear pain, rhinorrhea, sinus pain and sore throat.   Respiratory:  Negative for cough, shortness of breath and wheezing.   Cardiovascular:  Negative for chest pain and leg swelling.  Gastrointestinal:  Negative for abdominal pain, blood in stool, diarrhea, nausea and vomiting.  Genitourinary:  Negative for dysuria, flank pain, frequency and urgency.  Neurological:  Negative for dizziness, tingling, numbness and headaches.       Objective    BP 134/71   Pulse 81   Temp 98.1 F (36.7 C) (Oral)   Resp 15   Wt 176 lb 8 oz (80.1 kg)   SpO2 97%   BMI 30.30 kg/m     Physical Exam Vitals reviewed.  Constitutional:      General: She is not in acute distress.    Appearance: Normal appearance. She is well-developed. She is not diaphoretic.  HENT:     Head: Normocephalic and atraumatic.  Eyes:     General: No scleral icterus.    Conjunctiva/sclera: Conjunctivae normal.  Neck:     Thyroid: No thyromegaly.  Cardiovascular:     Rate and Rhythm: Regular rhythm.  Pulmonary:     Breath sounds: No rales.  Musculoskeletal:     Left knee: Decreased range of motion.     Right lower leg: Edema present.     Left lower leg: Edema present.     Comments: Bilateral knee edema and limited knee flexion   Skin:    General: Skin is warm and dry.     Findings: No rash.  Neurological:     Mental Status: She is alert and oriented to person, place, and time. Mental status is at baseline.  Psychiatric:        Mood and Affect: Mood normal.        Behavior: Behavior normal.      No results found for any visits on 12/21/20.  Assessment & Plan     Problem List Items Addressed This Visit       Musculoskeletal and Integument   Osteoarthritis - Primary  -Longstanding problem of left knee osteoarthritis since 2003 - reviewed last XRay with severe tricompartmental OA of the left knee - Status post right TKR - Does not want to pursue surgical options for her left knee - Has done well in the past with corticosteroid injections for her left knee - Last one was in 2019 - Discussed home exercise program, rest, ice, elevation - Agreed to proceed with corticosteroid injection today   INJECTION: Patient was given informed consent,. Appropriate time out was taken. Area prepped and draped in usual sterile fashion.  1 cc of depo-medrol 40 mg/ml plus 4 cc of 1% lidocaine without epinephrine was injected into the left knee  using a(n) anterior lateral approach. The patient tolerated the procedure well. There were no complications. Post procedure instructions were given.    Return if symptoms worsen or fail to improve.      Frederic Jericho Moorehead,acting as a Education administrator for Lavon Paganini, MD.,have documented all relevant documentation on the behalf of Lavon Paganini, MD,as directed by  Lavon Paganini, MD while in the presence of Lavon Paganini, MD.  I, Lavon Paganini, MD, have reviewed all documentation for this visit. The documentation on 12/21/20 for the exam, diagnosis, procedures, and orders are all accurate and complete.  Tifani Dack, Dionne Bucy, MD, MPH [facility]

## 2021-01-14 ENCOUNTER — Ambulatory Visit (INDEPENDENT_AMBULATORY_CARE_PROVIDER_SITE_OTHER): Payer: Medicare Other | Admitting: Family Medicine

## 2021-01-14 ENCOUNTER — Ambulatory Visit
Admission: RE | Admit: 2021-01-14 | Discharge: 2021-01-14 | Disposition: A | Discharge status: Home / Self Care | Payer: Medicare Other | Attending: Family Medicine | Admitting: Family Medicine

## 2021-01-14 ENCOUNTER — Other Ambulatory Visit: Payer: Self-pay

## 2021-01-14 ENCOUNTER — Ambulatory Visit
Admission: RE | Admit: 2021-01-14 | Discharge: 2021-01-14 | Disposition: A | Discharge status: Home / Self Care | Payer: Medicare Other | Source: Ambulatory Visit | Attending: Family Medicine | Admitting: Family Medicine

## 2021-01-14 ENCOUNTER — Encounter: Payer: Self-pay | Admitting: Family Medicine

## 2021-01-14 VITALS — BP 152/79 | HR 72 | Temp 97.7°F | Resp 18 | Ht 64.0 in | Wt 176.8 lb

## 2021-01-14 DIAGNOSIS — M79672 Pain in left foot: Secondary | ICD-10-CM

## 2021-01-14 DIAGNOSIS — Z0000 Encounter for general adult medical examination without abnormal findings: Secondary | ICD-10-CM | POA: Diagnosis not present

## 2021-01-14 DIAGNOSIS — I10 Essential (primary) hypertension: Secondary | ICD-10-CM

## 2021-01-14 DIAGNOSIS — E6609 Other obesity due to excess calories: Secondary | ICD-10-CM | POA: Diagnosis not present

## 2021-01-14 DIAGNOSIS — Z6830 Body mass index (BMI) 30.0-30.9, adult: Secondary | ICD-10-CM

## 2021-01-14 DIAGNOSIS — E782 Mixed hyperlipidemia: Secondary | ICD-10-CM

## 2021-01-14 NOTE — Progress Notes (Signed)
I,Roshena L Chambers,acting as a scribe for Wilhemena Durie, MD.,have documented all relevant documentation on the behalf of Wilhemena Durie, MD,as directed by  Wilhemena Durie, MD while in the presence of Wilhemena Durie, MD.     Annual Wellness Visit     Patient: Michelle Lutz, Female    DOB: Aug 12, 1940, 80 y.o.   MRN: [id number] Visit Date: 01/14/2021  Today's Provider: Wilhemena Durie, MD   Chief Complaint  Patient presents with   Medicare Wellness   Subjective    Michelle Lutz is a 80 y.o. female who presents today for her Annual Wellness Visit.Annual Physical. She reports consuming a general diet. Gym/ health club routine includes cardio and stationary bike. She generally feels fairly well. She reports sleeping fairly well. 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 She did have pain yesterday in the right occiput that she thinks is from clenching her teeth. Cardiology has her holding her HCTZ if her systolic is less than [id number] due to dizziness orthostasis. Patient complains of pain on the dorsum of the left foot which appears to be worse with weightbearing. HPI       Medications: Outpatient Medications Prior to Visit  Medication Sig   Biotin 10 MG CAPS Take 10 mg by mouth daily.    Calcium Carb-Cholecalciferol (CALCIUM CARBONATE-VITAMIN D3) 600-400 MG-UNIT TABS Take 1 tablet by mouth 2 (two) times daily.   carboxymethylcellulose (REFRESH PLUS) 0.5 % SOLN Place 1 drop into both eyes 4 (four) times daily as needed (dry eyes).    cetirizine (ZYRTEC) 10 MG tablet Take 10 mg by mouth daily.   Cholecalciferol 25 MCG (1000 UT) tablet Take 1,000 Units by mouth daily.    Cranberry 450 MG TABS Take 450 mg by mouth daily.   diphenoxylate-atropine (LOMOTIL) 2.5-0.025 MG tablet Take 1 tablet by mouth 4 (four) times daily as needed for  diarrhea or loose stools.   doxazosin (CARDURA) 2 MG tablet Take 0.5 tablets (1 mg total) by mouth 2 (two) times daily.   fluticasone (FLONASE) 50 MCG/ACT nasal spray Place 2 sprays into both nostrils daily.   gabapentin (NEURONTIN) 100 MG capsule TAKE 1 CAPSULE BY MOUTH THREE TIMES DAILY   hydrochlorothiazide (MICROZIDE) 12.5 MG capsule Take 1 capsule by mouth once daily   HYDROcodone-acetaminophen (NORCO) 7.5-325 MG tablet Take 1 tablet by mouth every 6 (six) hours as needed for moderate pain or severe pain.    ipratropium (ATROVENT) 0.06 % nasal spray Place 2 sprays into both nostrils 2 (two) times daily.   irbesartan (AVAPRO) 300 MG tablet Take 1 tablet by mouth once daily   metroNIDAZOLE (METROCREAM) 0.75 % cream Apply 1 application topically 2 (two) times daily.   montelukast (SINGULAIR) 10 MG tablet TAKE 1 TABLET BY MOUTH AT BEDTIME   Multiple Vitamin (MULTIVITAMIN) capsule Take 1 capsule by mouth daily.   omeprazole (PRILOSEC) 20 MG capsule Take 20 mg by mouth daily.   pravastatin (PRAVACHOL) 40 MG tablet Take 1 tablet (40 mg total) by mouth daily.   Probiotic Product (ALIGN) 4 MG CAPS Take 4 mg by mouth daily.    sodium chloride (MURO 128) 5 % ophthalmic ointment Place 1 application into both eyes as needed for eye irritation.    traMADol (ULTRAM) 50 MG tablet Take 1 tablet (50 mg total) by mouth every 6 (  six) hours as needed for moderate pain.   zolpidem (AMBIEN) 5 MG tablet Take 5 mg by mouth at bedtime as needed for sleep.   [DISCONTINUED] benzonatate (TESSALON) 100 MG capsule Take 1 capsule (100 mg total) by mouth 2 (two) times daily as needed for cough. (Patient not taking: No sig reported)   No facility-administered medications prior to visit.    Allergies  Allergen Reactions   Aspirin    Benazepril Cough   Zocor [Simvastatin]     Caused pain    Patient Care Team: Jerrol Banana., MD as PCP - General (Family Medicine)  Review of Systems  Constitutional:   Negative for chills, fatigue and fever.  HENT:  Positive for ear pain (below right ear). Negative for congestion, rhinorrhea, sneezing and sore throat.   Eyes: Negative.  Negative for pain and redness.  Respiratory:  Negative for cough, shortness of breath and wheezing.   Cardiovascular:  Negative for chest pain and leg swelling.  Gastrointestinal:  Negative for abdominal pain, blood in stool, constipation, diarrhea and nausea.  Endocrine: Negative for polydipsia and polyphagia.  Genitourinary: Negative.  Negative for dysuria, flank pain, hematuria, pelvic pain, vaginal bleeding and vaginal discharge.  Musculoskeletal:  Negative for arthralgias, back pain, gait problem and joint swelling.  Skin:  Negative for rash.  Neurological: Negative.  Negative for dizziness, tremors, seizures, weakness, light-headedness, numbness and headaches.  Hematological:  Negative for adenopathy.  Psychiatric/Behavioral: Negative.  Negative for behavioral problems, confusion and dysphoric mood. The patient is not nervous/anxious and is not hyperactive.   All other systems reviewed and are negative.       Objective    Vitals: BP (!) 152/79 (BP Location: Right Arm, Patient Position: Sitting, Cuff Size: Large)   Pulse 72   Temp 97.7 F (36.5 C) (Temporal)   Resp 18   Ht '5\' 4"'$  (1.626 m)   Wt 176 lb 12.8 oz (80.2 kg)   BMI 30.35 kg/m  BP Readings from Last 3 Encounters:  01/14/21 (!) 152/79  12/21/20 134/71  12/05/20 (!) 160/72   Wt Readings from Last 3 Encounters:  01/14/21 176 lb 12.8 oz (80.2 kg)  12/21/20 176 lb 8 oz (80.1 kg)  12/05/20 178 lb (80.7 kg)      Physical Exam Vitals reviewed.  Constitutional:      Appearance: She is obese.  HENT:     Head: Normocephalic and atraumatic.     Right Ear: External ear normal.     Left Ear: External ear normal.  Eyes:     General: No scleral icterus.    Conjunctiva/sclera: Conjunctivae normal.  Cardiovascular:     Rate and Rhythm: Normal rate and  regular rhythm.     Heart sounds: Normal heart sounds.  Pulmonary:     Effort: Pulmonary effort is normal.     Breath sounds: Normal breath sounds.  Chest:  Breasts:    Right: Normal.     Left: Normal.  Abdominal:     Palpations: Abdomen is soft.  Lymphadenopathy:     Cervical: No cervical adenopathy.  Skin:    General: Skin is warm and dry.  Neurological:     General: No focal deficit present.     Mental Status: She is alert and oriented to person, place, and time.  Psychiatric:        Mood and Affect: Mood normal.        Behavior: Behavior normal.        Thought Content: Thought  content normal.        Judgment: Judgment normal.     Most recent functional status assessment: In your present state of health, do you have any difficulty performing the following activities: 08/13/2020  Hearing? N  Vision? N  Difficulty concentrating or making decisions? Y  Walking or climbing stairs? Y  Dressing or bathing? N  Doing errands, shopping? N  Some recent data might be hidden   Most recent fall risk assessment: Fall Risk  08/13/2020  Falls in the past year? 0  Number falls in past yr: 0  Injury with Fall? 0  Follow up Falls evaluation completed    Most recent depression screenings: PHQ 2/9 Scores 08/13/2020 02/14/2020  PHQ - 2 Score 0 0  PHQ- 9 Score 0 2   Most recent cognitive screening: 6CIT Screen 02/14/2020  What Year? 0 points  What month? 0 points  What time? 0 points  Count back from 20 0 points  Months in reverse 0 points  Repeat phrase 4 points  Total Score 4   Most recent Audit-C alcohol use screening Alcohol Use Disorder Test (AUDIT) 08/13/2020  1. How often do you have a drink containing alcohol? 0  2. How many drinks containing alcohol do you have on a typical day when you are drinking? 0  3. How often do you have six or more drinks on one occasion? -  AUDIT-C Score -  Alcohol Brief Interventions/Follow-up AUDIT Score <7 follow-up not indicated   A score  of 3 or more in women, and 4 or more in men indicates increased risk for alcohol abuse, EXCEPT if all of the points are from question 1   No results found for any visits on 01/14/21.  Assessment & Plan     Annual wellness visit done today including the all of the following: Reviewed patient's Family Medical History Reviewed and updated list of patient's medical providers Assessment of cognitive impairment was done Assessed patient's functional ability Established a written schedule for health screening [place] Completed and Reviewed  Exercise Activities and Dietary recommendations  Goals   None     Immunization History  Administered Date(s) Administered   Fluad Quad(high Dose 65+) 02/14/2020   Influenza, High Dose Seasonal PF 03/04/2017, 03/16/2018, 02/08/2019   Influenza, Seasonal, Injecte, Preservative Fre 03/01/2012, 03/18/2013, 03/10/2014   Influenza,inj,Quad PF,6+ Mos 03/23/2015, 04/09/2016   PFIZER(Purple Top)SARS-COV-2 Vaccination 06/01/2019, 06/25/2019, 01/20/2020   Pneumococcal Conjugate-13 09/07/2013   Pneumococcal Polysaccharide-23 07/22/2007   Tdap 05/25/1996, 10/21/2006, 08/06/2011   Zoster Recombinat (Shingrix) 10/09/2016, 12/20/2016   Zoster, Live 08/02/2007    Health Maintenance  Topic Date Due   Hepatitis C Screening  Never done   COVID-19 Vaccine (4 - Booster for [place] series) 04/21/2020   INFLUENZA VACCINE  12/24/2020   TETANUS/TDAP  08/05/2021   DEXA SCAN  Completed   PNA vac Low Risk Adult  Completed   Zoster Vaccines- Shingrix  Completed   HPV VACCINES  Aged Out     Discussed health benefits of physical activity, and encouraged her to engage in regular exercise appropriate for her age and condition.    1. Medicare annual wellness visit, subsequent   2. Annual physical exam   3. Essential hypertension  - CBC w/Diff/Platelet - Comprehensive Metabolic Panel (CMET)  4. Mixed hyperlipidemia  - Lipid panel - TSH -  Comprehensive Metabolic Panel (CMET)  5. Class 1 obesity due to excess calories with serious comorbidity and body mass index (BMI) of 30.0  to 30.9 in adult   6. Left foot pain Tried turmeric daily for knee and foot pain.  Also try topical Voltaren gel. - DG Foot Complete Left; Future   Return in about 6 months (around 07/17/2021).     I, Wilhemena Durie, MD, have reviewed all documentation for this visit. The documentation on 01/20/21 for the exam, diagnosis, procedures, and orders are all accurate and complete.    Mckenna Boruff Cranford Mon, MD  [facility] [phone] (phone) [phone] (fax)  [place]

## 2021-01-14 NOTE — Patient Instructions (Signed)
Try over the counter Turmeric daily  Try over the counter topical Voltaren gel for knees and feet

## 2021-01-15 LAB — TSH: TSH: 4.29 u[IU]/mL (ref 0.450–4.500)

## 2021-01-15 LAB — CBC WITH DIFFERENTIAL/PLATELET
Basophils Absolute: 0 10*3/uL (ref 0.0–0.2)
Basos: 1 %
EOS (ABSOLUTE): 0.1 10*3/uL (ref 0.0–0.4)
Eos: 1 %
Hematocrit: 36.6 % (ref 34.0–46.6)
Hemoglobin: 12.4 g/dL (ref 11.1–15.9)
Immature Grans (Abs): 0 10*3/uL (ref 0.0–0.1)
Immature Granulocytes: 0 %
Lymphocytes Absolute: 1.9 10*3/uL (ref 0.7–3.1)
Lymphs: 35 %
MCH: 31.7 pg (ref 26.6–33.0)
MCHC: 33.9 g/dL (ref 31.5–35.7)
MCV: 94 fL (ref 79–97)
Monocytes Absolute: 0.5 10*3/uL (ref 0.1–0.9)
Monocytes: 10 %
Neutrophils Absolute: 2.9 10*3/uL (ref 1.4–7.0)
Neutrophils: 53 %
Platelets: 215 10*3/uL (ref 150–450)
RBC: 3.91 x10E6/uL (ref 3.77–5.28)
RDW: 12.1 % (ref 11.7–15.4)
WBC: 5.5 10*3/uL (ref 3.4–10.8)

## 2021-01-15 LAB — LIPID PANEL
Chol/HDL Ratio: 3 ratio (ref 0.0–4.4)
Cholesterol, Total: 185 mg/dL (ref 100–199)
HDL: 61 mg/dL (ref >39)
LDL Chol Calc (NIH): 109 mg/dL — ABNORMAL HIGH (ref 0–99)
Triglycerides: 83 mg/dL (ref 0–149)
VLDL Cholesterol Cal: 15 mg/dL (ref 5–40)

## 2021-01-15 LAB — COMPREHENSIVE METABOLIC PANEL
ALT: 12 IU/L (ref 0–32)
AST: 19 IU/L (ref 0–40)
Albumin/Globulin Ratio: 1.6 (ref 1.2–2.2)
Albumin: 4.2 g/dL (ref 3.7–4.7)
Alkaline Phosphatase: 78 IU/L (ref 44–121)
BUN/Creatinine Ratio: 19 (ref 12–28)
BUN: 15 mg/dL (ref 8–27)
Bilirubin Total: 0.6 mg/dL (ref 0.0–1.2)
CO2: 22 mmol/L (ref 20–29)
Calcium: 9.3 mg/dL (ref 8.7–10.3)
Chloride: 102 mmol/L (ref 96–106)
Creatinine, Ser: 0.81 mg/dL (ref 0.57–1.00)
Globulin, Total: 2.7 g/dL (ref 1.5–4.5)
Glucose: 98 mg/dL (ref 65–99)
Potassium: 4.1 mmol/L (ref 3.5–5.2)
Sodium: 140 mmol/L (ref 134–144)
Total Protein: 6.9 g/dL (ref 6.0–8.5)
eGFR: 74 mL/min/{1.73_m2} (ref >59)

## 2021-01-17 ENCOUNTER — Ambulatory Visit
Admission: RE | Admit: 2021-01-17 | Discharge: 2021-01-17 | Disposition: A | Discharge status: Home / Self Care | Payer: Medicare Other | Source: Ambulatory Visit | Attending: Family Medicine | Admitting: Family Medicine

## 2021-01-17 ENCOUNTER — Other Ambulatory Visit: Payer: Self-pay

## 2021-01-17 DIAGNOSIS — Z1231 Encounter for screening mammogram for malignant neoplasm of breast: Secondary | ICD-10-CM | POA: Diagnosis not present

## 2021-01-29 ENCOUNTER — Other Ambulatory Visit: Payer: Self-pay | Admitting: Family Medicine

## 2021-01-29 ENCOUNTER — Other Ambulatory Visit: Payer: Self-pay | Admitting: *Deleted

## 2021-01-29 DIAGNOSIS — J310 Chronic rhinitis: Secondary | ICD-10-CM

## 2021-01-29 DIAGNOSIS — M79672 Pain in left foot: Secondary | ICD-10-CM

## 2021-01-29 NOTE — Telephone Encounter (Signed)
Refilled

## 2021-02-13 DIAGNOSIS — G4733 Obstructive sleep apnea (adult) (pediatric): Secondary | ICD-10-CM | POA: Diagnosis not present

## 2021-02-18 ENCOUNTER — Other Ambulatory Visit: Payer: Self-pay | Admitting: Family Medicine

## 2021-02-18 DIAGNOSIS — E785 Hyperlipidemia, unspecified: Secondary | ICD-10-CM

## 2021-03-14 ENCOUNTER — Other Ambulatory Visit: Payer: Self-pay | Admitting: Family Medicine

## 2021-03-14 NOTE — Telephone Encounter (Signed)
Requested Prescriptions  Pending Prescriptions Disp Refills  . gabapentin (NEURONTIN) 100 MG capsule [Pharmacy Med Name: Gabapentin 100 MG Oral Capsule] 90 capsule 2    Sig: TAKE 1 CAPSULE BY MOUTH THREE TIMES DAILY     Neurology: Anticonvulsants - gabapentin Passed - 03/14/2021  9:30 AM      Passed - Valid encounter within last 12 months    Recent Outpatient Visits          1 month ago Medicare annual wellness visit, subsequent   [facility] Jerrol Banana., MD   2 months ago Primary osteoarthritis of left knee   [facility] [place], Dionne Bucy, MD   4 months ago Cough   [facility] Virginia Crews, MD   6 months ago Fall, initial encounter   [facility] Jerrol Banana., MD   6 months ago Diarrhea of presumed infectious origin   [place], Kelby Aline, FNP      Future Appointments            In 4 months Jerrol Banana., MD [facility], [place]

## 2021-03-20 ENCOUNTER — Other Ambulatory Visit: Payer: Self-pay | Admitting: Family Medicine

## 2021-03-21 NOTE — Telephone Encounter (Signed)
Requested Prescriptions  Pending Prescriptions Disp Refills  . irbesartan (AVAPRO) 300 MG tablet [Pharmacy Med Name: Irbesartan 300 MG Oral Tablet] 90 tablet 0    Sig: Take 1 tablet by mouth once daily     Cardiovascular:  Angiotensin Receptor Blockers Failed - 03/20/2021  3:35 PM      Failed - Last BP in normal range    BP Readings from Last 1 Encounters:  01/14/21 (!) 152/79         Passed - Cr in normal range and within 180 days    Creatinine, Ser  Date Value Ref Range Status  01/14/2021 0.81 0.57 - 1.00 mg/dL Final         Passed - K in normal range and within 180 days    Potassium  Date Value Ref Range Status  01/14/2021 4.1 3.5 - 5.2 mmol/L Final         Passed - Patient is not pregnant      Passed - Valid encounter within last 6 months    Recent Outpatient Visits          2 months ago Medicare annual wellness visit, subsequent   [facility] Jerrol Banana., MD   3 months ago Primary osteoarthritis of left knee   [facility] [place], Dionne Bucy, MD   5 months ago Cough   [facility] Virginia Crews, MD   6 months ago Fall, initial encounter   [facility] Jerrol Banana., MD   7 months ago Diarrhea of presumed infectious origin   [place], Kelby Aline, FNP      Future Appointments            In 3 months Jerrol Banana., MD [facility], [place]

## 2021-04-23 ENCOUNTER — Other Ambulatory Visit: Payer: Self-pay | Admitting: Family Medicine

## 2021-04-23 DIAGNOSIS — I10 Essential (primary) hypertension: Secondary | ICD-10-CM

## 2021-05-12 ENCOUNTER — Other Ambulatory Visit: Payer: Self-pay | Admitting: Family Medicine

## 2021-06-04 ENCOUNTER — Ambulatory Visit: Payer: Self-pay | Admitting: *Deleted

## 2021-06-04 ENCOUNTER — Encounter: Payer: Self-pay | Admitting: Family Medicine

## 2021-06-04 NOTE — Telephone Encounter (Signed)
Virtual visit scheduled for 06/05/21 9am.

## 2021-06-04 NOTE — Telephone Encounter (Signed)
Chief Complaint: requesting a call from PCP. Sent My Chart message positive for covid today.  Symptoms: sore throat, fever, cough, headache  Frequency: sx started yesterday  Pertinent Negatives: Patient denies chest pain, difficulty breathing, no runny nose. Disposition: [] ED /[x] Urgent Care (no appt availability in office) / [] Appointment(In office/virtual)/ []  [place] [facility]/ [] Home Care/ [] Refused Recommended Disposition /[] [place] [facility]/ [x]  Follow-up with PCP Additional Notes:  Requesting medication for positive covid. Recommended patient's husband to test or covid today . Fever now 101.1 recommended to take tylenol and recheck temp. And call back. Patient requesting tessalon for cough and possible antiviral . Requesting TE due to difficulty navigating My Chart  and scheduling VV in am . Please advise. Patient would like a call back today regarding appt.       Reason for Disposition  [1] Fever > 101 F (38.3 C) AND [2] age > 60 years  Answer Assessment - Initial Assessment Questions 1. COVID-19 DIAGNOSIS: "Who made your COVID-19 diagnosis?" "Was it confirmed by a positive lab test or self-test?" If not diagnosed by a doctor (or NP/PA), ask "Are there lots of cases (community spread) where you live?" Note: See public health department website, if unsure.     Positive at home test today  2. COVID-19 EXPOSURE: "Was there any known exposure to COVID before the symptoms began?" CDC Definition of close contact: within 6 feet (2 meters) for a total of 15 minutes or more over a 24-hour period.      Family close contact  3. ONSET: "When did the COVID-19 symptoms start?"      Yesterday 06/03/21  4. WORST SYMPTOM: "What is your worst symptom?" (e.g., cough, fever, shortness of breath, muscle aches)     Sore throat   fever, cough  5. COUGH: "Do you have a cough?" If Yes, ask: "How bad is the cough?"       Yes mild  6. FEVER: "Do you have a fever?" If Yes, ask: "What is your  temperature, how was it measured, and when did it start?"     101.1 today  7. RESPIRATORY STATUS: "Describe your breathing?" (e.g., shortness of breath, wheezing, unable to speak)      None  8. BETTER-SAME-WORSE: "Are you getting better, staying the same or getting worse compared to yesterday?"  If getting worse, ask, "In what way?"     Sx started yesterday fever today 9. HIGH RISK DISEASE: "Do you have any chronic medical problems?" (e.g., asthma, heart or lung disease, weak immune system, obesity, etc.)     No  10. VACCINE: "Have you had the COVID-19 vaccine?" If Yes, ask: "Which one, how many shots, when did you get it?"       X 2 Pfizer  11. BOOSTER: "Have you received your COVID-19 booster?" If Yes, ask: "Which one and when did you get it?"       [place]  12. PREGNANCY: "Is there any chance you are pregnant?" "When was your last menstrual period?"       na 13. OTHER SYMPTOMS: "Do you have any other symptoms?"  (e.g., chills, fatigue, headache, loss of smell or taste, muscle pain, sore throat)      cough sore throat , fever ,  14. O2 SATURATION MONITOR:  "Do you use an oxygen saturation monitor (pulse oximeter) at home?" If Yes, ask "What is your reading (oxygen level) today?" "What is your usual oxygen saturation reading?" (e.g., 95%)  na  Protocols used: Coronavirus (UGQBV-69) Diagnosed or Suspected-A-AH

## 2021-06-05 ENCOUNTER — Telehealth (INDEPENDENT_AMBULATORY_CARE_PROVIDER_SITE_OTHER): Payer: Medicare Other | Admitting: Family Medicine

## 2021-06-05 VITALS — BP 136/84 | HR 78 | Temp 101.8°F

## 2021-06-05 DIAGNOSIS — U071 COVID-19: Secondary | ICD-10-CM | POA: Diagnosis not present

## 2021-06-05 MED ORDER — MOLNUPIRAVIR EUA 200MG CAPSULE: 4.0000 | ORAL_CAPSULE | Freq: Two times a day (BID) | ORAL | 0 refills | Status: AC

## 2021-06-05 MED ORDER — BENZONATATE 200 MG PO CAPS: 200.0000 mg | ORAL_CAPSULE | Freq: Two times a day (BID) | ORAL | 0 refills | Status: DC | PRN

## 2021-06-05 NOTE — Patient Instructions (Signed)
It was great to see you!  Our plans for today:  - See below for self-isolation guidelines. You may end your quarantine once you are 10 days from symptom onset and fever free for 24 hours without use of tylenol or ibuprofen. Wear a well-fitting N95 mask if you have to go out and about. - Take the molnupiravir as directed. See [email or web address] for more information about the medication. - I recommend getting vaccinated with your booster once you are healed from your current infection if you have not already done so. - Certainly, if you are having difficulties breathing, chest pain, or unable to keep down fluids, go to the Emergency Department.   Take care and seek immediate care sooner if you develop any concerns.   Dr. Ky Barban     Person Under Monitoring Name: Michelle Lutz  Location: [address] [place] [address]   Infection Prevention Recommendations for Individuals Confirmed to have, or Being Evaluated for, 2019 Novel Coronavirus (COVID-19) Infection Who Receive Care at Home  Individuals who are confirmed to have, or are being evaluated for, COVID-19 should follow the prevention steps below until a healthcare provider or local or state health department says they can return to normal activities.  Stay home except to get medical care You should restrict activities outside your home, except for getting medical care. Do not go to work, school, or public areas, and do not use public transportation or taxis.  Call ahead before visiting your doctor Before your medical appointment, call the healthcare provider and tell them that you have, or are being evaluated for, COVID-19 infection. This will help the healthcare providers office take steps to keep other people from getting infected. Ask your healthcare provider to call the local or state health department.  Monitor your symptoms Seek prompt medical attention if your illness is worsening (e.g.,  difficulty breathing). Before going to your medical appointment, call the healthcare provider and tell them that you have, or are being evaluated for, COVID-19 infection. Ask your healthcare provider to call the local or state health department.  Wear a facemask You should wear a facemask that covers your nose and mouth when you are in the same room with other people and when you visit a healthcare provider. People who live with or visit you should also wear a facemask while they are in the same room with you.  Separate yourself from other people in your home As much as possible, you should stay in a different room from other people in your home. Also, you should use a separate bathroom, if available.  Avoid sharing household items You should not share dishes, drinking glasses, cups, eating utensils, towels, bedding, or other items with other people in your home. After using these items, you should wash them thoroughly with soap and water.  Cover your coughs and sneezes Cover your mouth and nose with a tissue when you cough or sneeze, or you can cough or sneeze into your sleeve. Throw used tissues in a lined trash can, and immediately wash your hands with soap and water for at least 20 seconds or use an alcohol-based hand rub.  Wash your [facility] your hands often and thoroughly with soap and water for at least 20 seconds. You can use an alcohol-based hand sanitizer if soap and water are not available and if your hands are not visibly dirty. Avoid touching your eyes, nose, and mouth with unwashed hands.   Prevention Steps for Caregivers and Household Members of  Individuals Confirmed to have, or Being Evaluated for, COVID-19 Infection Being Cared for in the Home  If you live with, or provide care at home for, a person confirmed to have, or being evaluated for, COVID-19 infection please follow these guidelines to prevent infection:  Follow healthcare providers instructions Make  sure that you understand and can help the patient follow any healthcare provider instructions for all care.  Provide for the patients basic needs You should help the patient with basic needs in the home and provide support for getting groceries, prescriptions, and other personal needs.  Monitor the patients symptoms If they are getting sicker, call his or her medical provider and tell them that the patient has, or is being evaluated for, COVID-19 infection. This will help the healthcare providers office take steps to keep other people from getting infected. Ask the healthcare provider to call the local or state health department.  Limit the number of people who have contact with the patient If possible, have only one caregiver for the patient. Other household members should stay in another home or place of residence. If this is not possible, they should stay in another room, or be separated from the patient as much as possible. Use a separate bathroom, if available. Restrict visitors who do not have an essential need to be in the home.  Keep older adults, very young children, and other sick people away from the patient Keep older adults, very young children, and those who have compromised immune systems or chronic health conditions away from the patient. This includes people with chronic heart, lung, or kidney conditions, diabetes, and cancer.  Ensure good ventilation Make sure that shared spaces in the home have good air flow, such as from an air conditioner or an opened window, weather permitting.  Wash your hands often Wash your hands often and thoroughly with soap and water for at least 20 seconds. You can use an alcohol based hand sanitizer if soap and water are not available and if your hands are not visibly dirty. Avoid touching your eyes, nose, and mouth with unwashed hands. Use disposable paper towels to dry your hands. If not available, use dedicated cloth towels and replace  them when they become wet.  Wear a facemask and gloves Wear a disposable facemask at all times in the room and gloves when you touch or have contact with the patients blood, body fluids, and/or secretions or excretions, such as sweat, saliva, sputum, nasal mucus, vomit, urine, or feces.  Ensure the mask fits over your nose and mouth tightly, and do not touch it during use. Throw out disposable facemasks and gloves after using them. Do not reuse. Wash your hands immediately after removing your facemask and gloves. If your personal clothing becomes contaminated, carefully remove clothing and launder. Wash your hands after handling contaminated clothing. Place all used disposable facemasks, gloves, and other waste in a lined container before disposing them with other household waste. Remove gloves and wash your hands immediately after handling these items.  Do not share dishes, glasses, or other household items with the patient Avoid sharing household items. You should not share dishes, drinking glasses, cups, eating utensils, towels, bedding, or other items with a patient who is confirmed to have, or being evaluated for, COVID-19 infection. After the person uses these items, you should wash them thoroughly with soap and water.  Wash laundry thoroughly Immediately remove and wash clothes or bedding that have blood, body fluids, and/or secretions or excretions, such as  sweat, saliva, sputum, nasal mucus, vomit, urine, or feces, on them. Wear gloves when handling laundry from the patient. Read and follow directions on labels of laundry or clothing items and detergent. In general, wash and dry with the warmest temperatures recommended on the label.  Clean all areas the individual has used often Clean all touchable surfaces, such as counters, tabletops, doorknobs, bathroom fixtures, toilets, phones, keyboards, tablets, and bedside tables, every day. Also, clean any surfaces that may have blood, body  fluids, and/or secretions or excretions on them. Wear gloves when cleaning surfaces the patient has come in contact with. Use a diluted bleach solution (e.g., dilute bleach with 1 part bleach and 10 parts water) or a household disinfectant with a label that says EPA-registered for coronaviruses. To make a bleach solution at home, add 1 tablespoon of bleach to 1 quart (4 cups) of water. For a larger supply, add  cup of bleach to 1 gallon (16 cups) of water. Read labels of cleaning products and follow recommendations provided on product labels. Labels contain instructions for safe and effective use of the cleaning product including precautions you should take when applying the product, such as wearing gloves or eye protection and making sure you have good ventilation during use of the product. Remove gloves and wash hands immediately after cleaning.  Monitor yourself for signs and symptoms of illness Caregivers and household members are considered close contacts, should monitor their health, and will be asked to limit movement outside of the home to the extent possible. Follow the monitoring steps for close contacts listed on the symptom monitoring form.   ? If you have additional questions, contact your local health department or call the epidemiologist on call at [phone] (available 24/7). ? This guidance is subject to change. For the most up-to-date guidance from [facility], please refer to their website: [email or web address]

## 2021-06-05 NOTE — Progress Notes (Signed)
Virtual Visit via Video Note  I connected with Michelle Lutz on 06/05/21 at  9:00 AM EST by a video enabled telemedicine application and verified that I am speaking with the correct person using two identifiers.  Location: Patient: home Provider: [facility]   I discussed the limitations of evaluation and management by telemedicine and the availability of in person appointments. The patient expressed understanding and agreed to proceed.  History of Present Illness:  UPPER RESPIRATORY TRACT INFECTION - symptom onset 06/03/20 - COVID + 06/04/20  Fever: yes Cough: yes, nonproductive Shortness of breath: no Chest pain: no Chest tightness: no Chest congestion: no Nasal congestion: yes Runny nose: yes Sore throat: yes Sinus pressure: yes Headache: yes Vomiting: no Fatigue: yes Sick contacts: yes Relief with OTC cold/cough medications:  hasn't tried   Treatments attempted: tylenol, zyrtec, singulair    Observations/Objective:  Well appearing, in NAD. Speaks in full sentences. Comfortable WOB on RA. No resp distress.   Assessment and Plan:  [id number] Doing well with mild sx. Is a candidate for COVID treatment, molnupiravir sent. Reviewed OTC symptom relief, self-quarantine guidelines, and emergency precautions.      I discussed the assessment and treatment plan with the patient. The patient was provided an opportunity to ask questions and all were answered. The patient agreed with the plan and demonstrated an understanding of the instructions.   The patient was advised to call back or seek an in-person evaluation if the symptoms worsen or if the condition fails to improve as anticipated.  I provided 9 minutes of non-face-to-face time during this encounter.   Myles Gip, DO

## 2021-06-05 NOTE — Telephone Encounter (Signed)
Patient had virtual visit done today.

## 2021-06-06 ENCOUNTER — Encounter: Payer: Self-pay | Admitting: Family Medicine

## 2021-06-15 ENCOUNTER — Other Ambulatory Visit: Payer: Self-pay | Admitting: Family Medicine

## 2021-06-15 NOTE — Telephone Encounter (Signed)
Requested Prescriptions  Pending Prescriptions Disp Refills   irbesartan (AVAPRO) 300 MG tablet [Pharmacy Med Name: Irbesartan 300 MG Oral Tablet] 90 tablet 0    Sig: Take 1 tablet by mouth once daily     Cardiovascular:  Angiotensin Receptor Blockers Passed - 06/15/2021 11:54 AM      Passed - Cr in normal range and within 180 days    Creatinine, Ser  Date Value Ref Range Status  01/14/2021 0.81 0.57 - 1.00 mg/dL Final         Passed - K in normal range and within 180 days    Potassium  Date Value Ref Range Status  01/14/2021 4.1 3.5 - 5.2 mmol/L Final         Passed - Patient is not pregnant      Passed - Last BP in normal range    BP Readings from Last 1 Encounters:  06/05/21 136/84         Passed - Valid encounter within last 6 months    Recent Outpatient Visits          1 week ago [place], Jake Church, DO   5 months ago Medicare annual wellness visit, subsequent   [facility] Jerrol Banana., MD   5 months ago Primary osteoarthritis of left knee   [facility] [place], Dionne Bucy, MD   8 months ago Cough   [facility] Virginia Crews, MD   9 months ago Fall, initial encounter   [facility] Jerrol Banana., MD      Future Appointments            In 1 month Jerrol Banana., MD [facility]

## 2021-06-21 DIAGNOSIS — H353132 Nonexudative age-related macular degeneration, bilateral, intermediate dry stage: Secondary | ICD-10-CM | POA: Diagnosis not present

## 2021-07-16 NOTE — Progress Notes (Signed)
Established patient visit   Patient: Michelle Lutz   DOB: 1941/02/12   81 y.o. Female  MRN: [id number] Visit Date: 07/17/2021  Today's healthcare provider: Wilhemena Durie, MD   Chief Complaint  Patient presents with   follow-up HTN   Subjective    HPI  Patient is in today for follow-up.  Home blood pressure readings are in good range with some being in the 001V to 494W systolic but she also has had some hypotensive numbers.  Has no syncope but feels lightheaded when this happens.  He is started drinking more fluids and she has not had any hypotension recently. Hypertension, follow-up  BP Readings from Last 3 Encounters:  07/17/21 (!) 147/86  06/05/21 136/84  01/14/21 (!) 152/79   Wt Readings from Last 3 Encounters:  07/17/21 175 lb 1.6 oz (79.4 kg)  01/14/21 176 lb 12.8 oz (80.2 kg)  12/21/20 176 lb 8 oz (80.1 kg)     She was last seen for hypertension 6 months ago.  BP at that visit was 152/79. Management since that visit includes; on .irbesartan and HCTZ She reports excellent compliance with treatment. She is not having side effects.  She is not exercising. She is not adherent to low salt diet.   Outside blood pressures are 140's-150's/90's-80's.   ---------------------------------------------------------------------------------------------------   Medications: Outpatient Medications Prior to Visit  Medication Sig   Biotin 10 MG CAPS Take 10 mg by mouth daily.    Calcium Carb-Cholecalciferol (CALCIUM CARBONATE-VITAMIN D3) 600-400 MG-UNIT TABS Take 1 tablet by mouth 2 (two) times daily.   carboxymethylcellulose (REFRESH PLUS) 0.5 % SOLN Place 1 drop into both eyes 4 (four) times daily as needed (dry eyes).    cetirizine (ZYRTEC) 10 MG tablet Take 10 mg by mouth daily.   Cholecalciferol 25 MCG (1000 UT) tablet Take 1,000 Units by mouth daily.    Cranberry 450 MG TABS Take 450 mg by mouth daily.   diphenoxylate-atropine (LOMOTIL) 2.5-0.025 MG tablet Take 1  tablet by mouth 4 (four) times daily as needed for diarrhea or loose stools.   doxazosin (CARDURA) 2 MG tablet Take 0.5 tablets (1 mg total) by mouth 2 (two) times daily.   fluticasone (FLONASE) 50 MCG/ACT nasal spray Use 2 spray(s) in each nostril once daily   gabapentin (NEURONTIN) 100 MG capsule TAKE 1 CAPSULE BY MOUTH THREE TIMES DAILY   hydrochlorothiazide (MICROZIDE) 12.5 MG capsule Take 1 capsule by mouth once daily   ipratropium (ATROVENT) 0.06 % nasal spray Place 2 sprays into both nostrils 2 (two) times daily.   irbesartan (AVAPRO) 300 MG tablet Take 1 tablet by mouth once daily   metroNIDAZOLE (METROCREAM) 0.75 % cream Apply 1 application topically 2 (two) times daily.   montelukast (SINGULAIR) 10 MG tablet TAKE 1 TABLET BY MOUTH AT BEDTIME   Multiple Vitamin (MULTIVITAMIN) capsule Take 1 capsule by mouth daily.   Multiple Vitamins-Minerals (PRESERVISION AREDS 2 PO) Take by mouth.   omeprazole (PRILOSEC) 20 MG capsule Take 20 mg by mouth daily.   pravastatin (PRAVACHOL) 40 MG tablet Take 1 tablet by mouth once daily   Probiotic Product (ALIGN) 4 MG CAPS Take 4 mg by mouth daily.    sodium chloride (MURO 128) 5 % ophthalmic ointment Place 1 application into both eyes as needed for eye irritation.    zolpidem (AMBIEN) 5 MG tablet Take 5 mg by mouth at bedtime as needed for sleep.   benzonatate (TESSALON) 200 MG capsule Take 1 capsule (200  mg total) by mouth 2 (two) times daily as needed for cough.   HYDROcodone-acetaminophen (NORCO) 7.5-325 MG tablet Take 1 tablet by mouth every 6 (six) hours as needed for moderate pain or severe pain.  (Patient not taking: Reported on 07/17/2021)   traMADol (ULTRAM) 50 MG tablet Take 1 tablet (50 mg total) by mouth every 6 (six) hours as needed for moderate pain. (Patient not taking: Reported on 06/05/2021)   No facility-administered medications prior to visit.    Review of Systems  Constitutional:  Negative for appetite change, chills, fatigue and  fever.  Respiratory:  Negative for chest tightness and shortness of breath.   Cardiovascular:  Negative for chest pain and palpitations.  Gastrointestinal:  Negative for abdominal pain, nausea and vomiting.  Neurological:  Negative for dizziness and weakness.       Objective    BP (!) 147/86 (BP Location: Left Arm, Patient Position: Sitting, Cuff Size: Large)    Pulse 73    Resp 16    Ht 5\' 4"  (1.626 m)    Wt 175 lb 1.6 oz (79.4 kg)    BMI 30.06 kg/m  BP Readings from Last 3 Encounters:  07/17/21 (!) 147/86  06/05/21 136/84  01/14/21 (!) 152/79   Wt Readings from Last 3 Encounters:  07/17/21 175 lb 1.6 oz (79.4 kg)  01/14/21 176 lb 12.8 oz (80.2 kg)  12/21/20 176 lb 8 oz (80.1 kg)      Physical Exam Vitals reviewed.  HENT:     Head: Normocephalic and atraumatic.     Right Ear: External ear normal.     Left Ear: External ear normal.  Eyes:     General: No scleral icterus.    Conjunctiva/sclera: Conjunctivae normal.  Neck:     Vascular: No carotid bruit.  Cardiovascular:     Rate and Rhythm: Normal rate and regular rhythm.     Heart sounds: Normal heart sounds.  Pulmonary:     Effort: Pulmonary effort is normal.     Breath sounds: Normal breath sounds.  Chest:  Breasts:    Right: Normal.     Left: Normal.  Abdominal:     Palpations: Abdomen is soft.  Lymphadenopathy:     Cervical: No cervical adenopathy.  Skin:    General: Skin is warm and dry.  Neurological:     General: No focal deficit present.     Mental Status: She is alert and oriented to person, place, and time.  Psychiatric:        Mood and Affect: Mood normal.        Behavior: Behavior normal.        Thought Content: Thought content normal.        Judgment: Judgment normal.      No results found for any visits on 07/17/21.  Assessment & Plan     1. Essential hypertension [place] hypertension.  Blood pressures at home are under mostly good control and with hydration she is avoiding symptomatic  hypotension.  Sinew to monitor at home.  2. Chronic rhinitis Seasonal spring pollen just started. - montelukast (SINGULAIR) 10 MG tablet; Take 1 tablet (10 mg total) by mouth at bedtime.  Dispense: 90 tablet; Refill: 3  3. Primary osteoarthritis of left knee   4. Osteoarthritis of spine with radiculopathy, lumbar region    No follow-ups on file.      I, Wilhemena Durie, MD, have reviewed all documentation for this visit. The documentation on 07/20/21 for the exam,  diagnosis, procedures, and orders are all accurate and complete.    Nami Strawder Cranford Mon, MD  [facility] [phone] (phone) [phone] (fax)  [place]

## 2021-07-17 ENCOUNTER — Other Ambulatory Visit: Payer: Self-pay

## 2021-07-17 ENCOUNTER — Ambulatory Visit (INDEPENDENT_AMBULATORY_CARE_PROVIDER_SITE_OTHER): Payer: Medicare Other | Admitting: Family Medicine

## 2021-07-17 ENCOUNTER — Encounter: Payer: Self-pay | Admitting: Family Medicine

## 2021-07-17 VITALS — BP 147/86 | HR 73 | Resp 16 | Ht 64.0 in | Wt 175.1 lb

## 2021-07-17 DIAGNOSIS — M1712 Unilateral primary osteoarthritis, left knee: Secondary | ICD-10-CM

## 2021-07-17 DIAGNOSIS — I10 Essential (primary) hypertension: Secondary | ICD-10-CM

## 2021-07-17 DIAGNOSIS — J310 Chronic rhinitis: Secondary | ICD-10-CM

## 2021-07-17 DIAGNOSIS — Z1329 Encounter for screening for other suspected endocrine disorder: Secondary | ICD-10-CM

## 2021-07-17 DIAGNOSIS — E66811 Obesity, class 1: Secondary | ICD-10-CM

## 2021-07-17 DIAGNOSIS — M4726 Other spondylosis with radiculopathy, lumbar region: Secondary | ICD-10-CM | POA: Diagnosis not present

## 2021-07-17 DIAGNOSIS — E782 Mixed hyperlipidemia: Secondary | ICD-10-CM

## 2021-07-17 DIAGNOSIS — E6609 Other obesity due to excess calories: Secondary | ICD-10-CM

## 2021-07-17 DIAGNOSIS — G4733 Obstructive sleep apnea (adult) (pediatric): Secondary | ICD-10-CM

## 2021-07-17 DIAGNOSIS — K219 Gastro-esophageal reflux disease without esophagitis: Secondary | ICD-10-CM

## 2021-07-17 DIAGNOSIS — Z0000 Encounter for general adult medical examination without abnormal findings: Secondary | ICD-10-CM

## 2021-07-17 DIAGNOSIS — M8580 Other specified disorders of bone density and structure, unspecified site: Secondary | ICD-10-CM

## 2021-07-20 MED ORDER — GABAPENTIN 100 MG PO CAPS: 100.0000 mg | ORAL_CAPSULE | Freq: Three times a day (TID) | ORAL | 2 refills | Status: DC

## 2021-07-20 MED ORDER — MONTELUKAST SODIUM 10 MG PO TABS: 10.0000 mg | ORAL_TABLET | Freq: Every day | ORAL | 3 refills | Status: AC

## 2021-08-07 ENCOUNTER — Ambulatory Visit (INDEPENDENT_AMBULATORY_CARE_PROVIDER_SITE_OTHER): Payer: Medicare Other

## 2021-08-07 ENCOUNTER — Other Ambulatory Visit: Payer: Self-pay

## 2021-08-07 VITALS — BP 152/70 | HR 59 | Temp 98.2°F | Ht 64.0 in | Wt 177.7 lb

## 2021-08-07 DIAGNOSIS — Z0000 Encounter for general adult medical examination without abnormal findings: Secondary | ICD-10-CM

## 2021-08-07 NOTE — Patient Instructions (Signed)
Michelle Lutz , ?Thank you for taking time to come for your Medicare Wellness Visit. I appreciate your ongoing commitment to your health goals. Please review the following plan we discussed and let me know if I can assist you in the future.  ? ?Screening recommendations/referrals: ?Colonoscopy: aged out ?Mammogram: aged out ?Bone Density: 01/17/20, every 5 years ?Recommended yearly ophthalmology/optometry visit for glaucoma screening and checkup ?Recommended yearly dental visit for hygiene and checkup ? ?Vaccinations: ?Influenza vaccine: 03/02/21 ?Pneumococcal vaccine: 09/07/13 ?Tdap vaccine: 08/06/11, due ?Shingles vaccine: Shingrix  10/09/16, 12/20/16   Zostavax 08/02/07 ?Covid-19:06/01/19, 06/25/19, 01/20/20 ? ?Advanced directives: no ? ?Conditions/risks identified: none ? ?Next appointment: Follow up in one year for your annual wellness visit 08/12/22 @ 9:40am in person ? ? ?Preventive Care 47 Years and Older, Female ?Preventive care refers to lifestyle choices and visits with your health care provider that can promote health and wellness. ?What does preventive care include? ?A yearly physical exam. This is also called an annual well check. ?Dental exams once or twice a year. ?Routine eye exams. Ask your health care provider how often you should have your eyes checked. ?Personal lifestyle choices, including: ?Daily care of your teeth and gums. ?Regular physical activity. ?Eating a healthy diet. ?Avoiding tobacco and drug use. ?Limiting alcohol use. ?Practicing safe sex. ?Taking low-dose aspirin every day. ?Taking vitamin and mineral supplements as recommended by your health care provider. ?What happens during an annual well check? ?The services and screenings done by your health care provider during your annual well check will depend on your age, overall health, lifestyle risk factors, and family history of disease. ?Counseling  ?Your health care provider may ask you questions about your: ?Alcohol use. ?Tobacco use. ?Drug  use. ?Emotional well-being. ?Home and relationship well-being. ?Sexual activity. ?Eating habits. ?History of falls. ?Memory and ability to understand (cognition). ?Work and work Statistician. ?Reproductive health. ?Screening  ?You may have the following tests or measurements: ?Height, weight, and BMI. ?Blood pressure. ?Lipid and cholesterol levels. These may be checked every 5 years, or more frequently if you are over 2 years old. ?Skin check. ?Lung cancer screening. You may have this screening every year starting at age 27 if you have a 30-pack-year history of smoking and currently smoke or have quit within the past 15 years. ?Fecal occult blood test (FOBT) of the stool. You may have this test every year starting at age 91. ?Flexible sigmoidoscopy or colonoscopy. You may have a sigmoidoscopy every 5 years or a colonoscopy every 10 years starting at age 74. ?Hepatitis C blood test. ?Hepatitis B blood test. ?Sexually transmitted disease (STD) testing. ?Diabetes screening. This is done by checking your blood sugar (glucose) after you have not eaten for a while (fasting). You may have this done every 1-3 years. ?Bone density scan. This is done to screen for osteoporosis. You may have this done starting at age 48. ?Mammogram. This may be done every 1-2 years. Talk to your health care provider about how often you should have regular mammograms. ?Talk with your health care provider about your test results, treatment options, and if necessary, the need for more tests. ?Vaccines  ?Your health care provider may recommend certain vaccines, such as: ?Influenza vaccine. This is recommended every year. ?Tetanus, diphtheria, and acellular pertussis (Tdap, Td) vaccine. You may need a Td booster every 10 years. ?Zoster vaccine. You may need this after age 31. ?Pneumococcal 13-valent conjugate (PCV13) vaccine. One dose is recommended after age 15. ?Pneumococcal polysaccharide (PPSV23) vaccine. One dose  is recommended after age  51. ?Talk to your health care provider about which screenings and vaccines you need and how often you need them. ?This information is not intended to replace advice given to you by your health care provider. Make sure you discuss any questions you have with your health care provider. ?Document Released: 06/08/2015 Document Revised: 01/30/2016 Document Reviewed: 03/13/2015 ?Elsevier Interactive Patient Education ? 2017 [place]. ? ?Fall Prevention in the Home ?Falls can cause injuries. They can happen to people of all ages. There are many things you can do to make your home safe and to help prevent falls. ?What can I do on the outside of my home? ?Regularly fix the edges of walkways and driveways and fix any cracks. ?Remove anything that might make you trip as you walk through a door, such as a raised step or threshold. ?Trim any bushes or trees on the path to your home. ?Use bright outdoor lighting. ?Clear any walking paths of anything that might make someone trip, such as rocks or tools. ?Regularly check to see if handrails are loose or broken. Make sure that both sides of any steps have handrails. ?Any raised decks and porches should have guardrails on the edges. ?Have any leaves, snow, or ice cleared regularly. ?Use sand or salt on walking paths during winter. ?Clean up any spills in your garage right away. This includes oil or grease spills. ?What can I do in the bathroom? ?Use night lights. ?Install grab bars by the toilet and in the tub and shower. Do not use towel bars as grab bars. ?Use non-skid mats or decals in the tub or shower. ?If you need to sit down in the shower, use a plastic, non-slip stool. ?Keep the floor dry. Clean up any water that spills on the floor as soon as it happens. ?Remove soap buildup in the tub or shower regularly. ?Attach bath mats securely with double-sided non-slip rug tape. ?Do not have throw rugs and other things on the floor that can make you trip. ?What can I do in the  bedroom? ?Use night lights. ?Make sure that you have a light by your bed that is easy to reach. ?Do not use any sheets or blankets that are too big for your bed. They should not hang down onto the floor. ?Have a firm chair that has side arms. You can use this for support while you get dressed. ?Do not have throw rugs and other things on the floor that can make you trip. ?What can I do in the kitchen? ?Clean up any spills right away. ?Avoid walking on wet floors. ?Keep items that you use a lot in easy-to-reach places. ?If you need to reach something above you, use a strong step stool that has a grab bar. ?Keep electrical cords out of the way. ?Do not use floor polish or wax that makes floors slippery. If you must use wax, use non-skid floor wax. ?Do not have throw rugs and other things on the floor that can make you trip. ?What can I do with my stairs? ?Do not leave any items on the stairs. ?Make sure that there are handrails on both sides of the stairs and use them. Fix handrails that are broken or loose. Make sure that handrails are as long as the stairways. ?Check any carpeting to make sure that it is firmly attached to the stairs. Fix any carpet that is loose or worn. ?Avoid having throw rugs at the top or bottom of the stairs.  If you do have throw rugs, attach them to the floor with carpet tape. ?Make sure that you have a light switch at the top of the stairs and the bottom of the stairs. If you do not have them, ask someone to add them for you. ?What else can I do to help prevent falls? ?Wear shoes that: ?Do not have high heels. ?Have rubber bottoms. ?Are comfortable and fit you well. ?Are closed at the toe. Do not wear sandals. ?If you use a stepladder: ?Make sure that it is fully opened. Do not climb a closed stepladder. ?Make sure that both sides of the stepladder are locked into place. ?Ask someone to hold it for you, if possible. ?Clearly mark and make sure that you can see: ?Any grab bars or  handrails. ?First and last steps. ?Where the edge of each step is. ?Use tools that help you move around (mobility aids) if they are needed. These include: ?Canes. ?Walkers. ?Scooters. ?Crutches. ?Turn on the lights when you go

## 2021-08-07 NOTE — Progress Notes (Signed)
Subjective:   Michelle Lutz is a 81 y.o. female who presents for Medicare Annual (Subsequent) preventive examination.  Review of Systems           Objective:    Today's Vitals   08/07/21 0931  BP: (!) 152/70  Pulse: (!) 59  Temp: 98.2 F (36.8 C)  TempSrc: Oral  SpO2: 99%  Weight: 177 lb 11.2 oz (80.6 kg)  Height: '5\' 4"'$  (1.626 m)   Body mass index is 30.5 kg/m.  Advanced Directives 09/03/2020 03/01/2019  Does Patient Have a Medical Advance Directive? Yes Yes  Type of Paramedic of [place];Living will Living will  Would patient like information on creating a medical advance directive? No - Patient declined -    Current Medications (verified) Outpatient Encounter Medications as of 08/07/2021  Medication Sig   benzonatate (TESSALON) 200 MG capsule Take 1 capsule (200 mg total) by mouth 2 (two) times daily as needed for cough.   Biotin 10 MG CAPS Take 10 mg by mouth daily.    Calcium Carb-Cholecalciferol (CALCIUM CARBONATE-VITAMIN D3) 600-400 MG-UNIT TABS Take 1 tablet by mouth 2 (two) times daily.   carboxymethylcellulose (REFRESH PLUS) 0.5 % SOLN Place 1 drop into both eyes 4 (four) times daily as needed (dry eyes).    cetirizine (ZYRTEC) 10 MG tablet Take 10 mg by mouth daily.   Cholecalciferol 25 MCG (1000 UT) tablet Take 1,000 Units by mouth daily.    Cranberry 450 MG TABS Take 450 mg by mouth daily.   diphenoxylate-atropine (LOMOTIL) 2.5-0.025 MG tablet Take 1 tablet by mouth 4 (four) times daily as needed for diarrhea or loose stools.   doxazosin (CARDURA) 2 MG tablet Take 0.5 tablets (1 mg total) by mouth 2 (two) times daily.   fluticasone (FLONASE) 50 MCG/ACT nasal spray Use 2 spray(s) in each nostril once daily   gabapentin (NEURONTIN) 100 MG capsule Take 1 capsule (100 mg total) by mouth 3 (three) times daily.   hydrochlorothiazide (MICROZIDE) 12.5 MG capsule Take 1 capsule by mouth once daily   HYDROcodone-acetaminophen (NORCO) 7.5-325 MG  tablet Take 1 tablet by mouth every 6 (six) hours as needed for moderate pain or severe pain.  (Patient not taking: Reported on 07/17/2021)   ipratropium (ATROVENT) 0.06 % nasal spray Place 2 sprays into both nostrils 2 (two) times daily.   irbesartan (AVAPRO) 300 MG tablet Take 1 tablet by mouth once daily   metroNIDAZOLE (METROCREAM) 0.75 % cream Apply 1 application topically 2 (two) times daily.   montelukast (SINGULAIR) 10 MG tablet Take 1 tablet (10 mg total) by mouth at bedtime.   Multiple Vitamin (MULTIVITAMIN) capsule Take 1 capsule by mouth daily.   Multiple Vitamins-Minerals (PRESERVISION AREDS 2 PO) Take by mouth.   omeprazole (PRILOSEC) 20 MG capsule Take 20 mg by mouth daily.   pravastatin (PRAVACHOL) 40 MG tablet Take 1 tablet by mouth once daily   Probiotic Product (ALIGN) 4 MG CAPS Take 4 mg by mouth daily.    sodium chloride (MURO 128) 5 % ophthalmic ointment Place 1 application into both eyes as needed for eye irritation.    traMADol (ULTRAM) 50 MG tablet Take 1 tablet (50 mg total) by mouth every 6 (six) hours as needed for moderate pain. (Patient not taking: Reported on 06/05/2021)   zolpidem (AMBIEN) 5 MG tablet Take 5 mg by mouth at bedtime as needed for sleep.   No facility-administered encounter medications on file as of 08/07/2021.    Allergies (verified)  Aspirin, Benazepril, and Zocor [simvastatin]   History: Past Medical History:  Diagnosis Date   Allergy    Arthritis    Basal cell carcinoma    left temple, right temple, forehead, nose- removed in [place]    Cataract    GERD (gastroesophageal reflux disease)    Hypertension    Osteoarthritis    Sleep apnea    Spinal stenosis    Past Surgical History:  Procedure Laterality Date   BREAST CYST ASPIRATION     20+ years   BREAST EXCISIONAL BIOPSY Right    1980's   CESAREAN SECTION     EYE SURGERY     FOOT SURGERY     JOINT REPLACEMENT Right 07/07/2016   Partial R. knee in 2008 then complete in  2018   uterine prolapse repair     Tacking uterus   Family History  Problem Relation Age of Onset   Heart disease Mother    Kidney disease Mother    Macular degeneration Mother    Arthritis Father    Heart disease Father    COPD Father    Arthritis Brother    Heart disease Brother    Hypertension Brother    Breast cancer Neg Hx    Social History   Socioeconomic History   Marital status: Married    Spouse name: Not on file   Number of children: 2   Years of education: Not on file   Highest education level: Not on file  Occupational History   Occupation: Surveyor, quantity: [employer]    Comment: for 35 years  Tobacco Use   Smoking status: Never   Smokeless tobacco: Never  Substance and Sexual Activity   Alcohol use: Not Currently   Drug use: Yes    Comment: prescribed hydrocodone   Sexual activity: Not on file  Other Topics Concern   Not on file  Social History Narrative   Not on file   Social Determinants of Health   Financial Resource Strain: Not on file  Food Insecurity: Not on file  Transportation Needs: Not on file  Physical Activity: Not on file  Stress: Not on file  Social Connections: Not on file    Tobacco Counseling Counseling given: Not Answered   Clinical Intake:  Pre-visit preparation completed: Yes  Pain : No/denies pain     Nutritional Risks: None Diabetes: No  How often do you need to have someone help you when you read instructions, pamphlets, or other written materials from your doctor or pharmacy?: 1 - Never  Diabetic?no  Interpreter Needed?: No  Information entered by :: Kirke Shaggy, LPN   Activities of Daily Living In your present state of health, do you have any difficulty performing the following activities: 07/17/2021 08/13/2020  Hearing? N N  Vision? Y N  Comment Left eye -  Difficulty concentrating or making decisions? N Y  Walking or climbing stairs? Y Y  Dressing or bathing? N N   Doing errands, shopping? N N  Some recent data might be hidden    Patient Care Team: Jerrol Banana., MD as PCP - General (Family Medicine)  Indicate any recent Medical Services you may have received from other than [facility] providers in the past year (date may be approximate).     Assessment:   This is a routine wellness examination for Nayzeth.  Hearing/Vision screen No results found.  Dietary issues and exercise activities discussed:  Goals Addressed   None    Depression Screen PHQ 2/9 Scores 07/17/2021 08/13/2020 02/14/2020 02/08/2019 10/05/2018  PHQ - 2 Score 0 0 0 0 0  PHQ- 9 Score - 0 2 0 -    Fall Risk Fall Risk  07/17/2021 08/13/2020 02/14/2020 02/08/2019 10/05/2018  Falls in the past year? 1 0 0 0 0  Number falls in past yr: 0 0 0 0 -  Injury with Fall? 0 0 0 0 -  Follow up - Falls evaluation completed Falls evaluation completed - -    FALL RISK PREVENTION PERTAINING TO THE HOME:  Any stairs in or around the home? No  If so, are there any without handrails? No  Home free of loose throw rugs in walkways, pet beds, electrical cords, etc? Yes  Adequate lighting in your home to reduce risk of falls? Yes   ASSISTIVE DEVICES UTILIZED TO PREVENT FALLS:  Life alert? No  Use of a cane, walker or w/c? No  Grab bars in the bathroom? Yes  Shower chair or bench in shower? Yes  Elevated toilet seat or a handicapped toilet? Yes   TIMED UP AND GO:  Was the test performed? Yes .  Length of time to ambulate 10 feet: 4 sec.   Gait steady and fast without use of assistive device  Cognitive Function:     6CIT Screen 02/14/2020  What Year? 0 points  What month? 0 points  What time? 0 points  Count back from 20 0 points  Months in reverse 0 points  Repeat phrase 4 points  Total Score 4    Immunizations Immunization History  Administered Date(s) Administered   Fluad Quad(high Dose 65+) 02/14/2020   Influenza, High Dose Seasonal PF 03/04/2017, 03/16/2018,  02/08/2019   Influenza, Seasonal, Injecte, Preservative Fre 03/01/2012, 03/18/2013, 03/10/2014   Influenza,inj,Quad PF,6+ Mos 03/23/2015, 04/09/2016   Influenza-Unspecified 03/02/2021   PFIZER(Purple Top)SARS-COV-2 Vaccination 06/01/2019, 06/25/2019, 01/20/2020   Pneumococcal Conjugate-13 09/07/2013   Pneumococcal Polysaccharide-23 07/22/2007   Tdap 05/25/1996, 10/21/2006, 08/06/2011   Zoster Recombinat (Shingrix) 10/09/2016, 12/20/2016   Zoster, Live 08/02/2007    TDAP status: Due, Education has been provided regarding the importance of this vaccine. Advised may receive this vaccine at local pharmacy or Health Dept. Aware to provide a copy of the vaccination record if obtained from local pharmacy or Health Dept. Verbalized acceptance and understanding.  Flu Vaccine status: Up to date  Pneumococcal vaccine status: Up to date  Covid-19 vaccine status: Completed vaccines  Qualifies for Shingles Vaccine? Yes   Zostavax completed Yes   Shingrix Completed?: Yes  Screening Tests Health Maintenance  Topic Date Due   COVID-19 Vaccine (4 - Booster for Pfizer series) 03/16/2020   TETANUS/TDAP  08/05/2021   Pneumonia Vaccine 32+ Years old  Completed   INFLUENZA VACCINE  Completed   DEXA SCAN  Completed   Zoster Vaccines- Shingrix  Completed   HPV VACCINES  Aged Out    Health Maintenance  Health Maintenance Due  Topic Date Due   COVID-19 Vaccine (4 - Booster for Pfizer series) 03/16/2020   TETANUS/TDAP  08/05/2021    Colorectal cancer screening: No longer required.   Mammogram status: No longer required due to age.  Bone Density status: Completed 01/17/20. Results reflect: Bone density results: OSTEOPENIA. Repeat every 5 years.  Lung Cancer Screening: (Low Dose CT Chest recommended if Age 60-80 years, 30 pack-year currently smoking OR have quit w/in 15years.) does not qualify.    Additional Screening:  Hepatitis C  Screening: does not qualify; Completed no  Vision  Screening: Recommended annual ophthalmology exams for early detection of glaucoma and other disorders of the eye. Is the patient up to date with their annual eye exam?  Yes  Who is the provider or what is the name of the office in which the patient attends annual eye exams? [facility] If pt is not established with a provider, would they like to be referred to a provider to establish care? No .   Dental Screening: Recommended annual dental exams for proper oral hygiene  Community Resource Referral / Chronic Care Management: CRR required this visit?  No   CCM required this visit?  No      Plan:     I have personally reviewed and noted the following in the patient's chart:   Medical and social history Use of alcohol, tobacco or illicit drugs  Current medications and supplements including opioid prescriptions.  Functional ability and status Nutritional status Physical activity Advanced directives List of other physicians Hospitalizations, surgeries, and ER visits in previous 12 months Vitals Screenings to include cognitive, depression, and falls Referrals and appointments  In addition, I have reviewed and discussed with patient certain preventive protocols, quality metrics, and best practice recommendations. A written personalized care plan for preventive services as well as general preventive health recommendations were provided to patient.     Dionisio David, LPN   [id number]   Nurse Notes: none

## 2021-08-13 ENCOUNTER — Ambulatory Visit (INDEPENDENT_AMBULATORY_CARE_PROVIDER_SITE_OTHER): Payer: Medicare Other | Admitting: Family Medicine

## 2021-08-13 ENCOUNTER — Other Ambulatory Visit: Payer: Self-pay

## 2021-08-13 DIAGNOSIS — N390 Urinary tract infection, site not specified: Secondary | ICD-10-CM | POA: Diagnosis not present

## 2021-08-13 LAB — POCT URINALYSIS DIPSTICK
Bilirubin, UA: NEGATIVE
Blood, UA: NEGATIVE
Glucose, UA: NEGATIVE
Ketones, UA: NEGATIVE
Nitrite, UA: NEGATIVE
Protein, UA: NEGATIVE
Spec Grav, UA: 1.01 (ref 1.010–1.025)
Urobilinogen, UA: 0.2 E.U./dL
pH, UA: 6 (ref 5.0–8.0)

## 2021-08-13 MED ORDER — NITROFURANTOIN MONOHYD MACRO 100 MG PO CAPS: 100.0000 mg | ORAL_CAPSULE | Freq: Two times a day (BID) | ORAL | 1 refills | Status: DC

## 2021-08-13 NOTE — Progress Notes (Signed)
?  ? ? ?Established patient visit ? ? ?Patient: Michelle Lutz   DOB: 1941/01/07   81 y.o. Female  MRN: [id number] ?Visit Date: 08/13/2021 ? ?Today's healthcare provider: Wilhemena Durie, MD  ? ?Chief Complaint  ?Patient presents with  ? Dysuria  ? ?Subjective  ?  ?HPI  ?PATIENT COMES IN TODAY WITH URINARY SYMPTOMS yesterday with pressure.  No dysuria no back pain, no hematuria no fever ? ?Medications: ?Outpatient Medications Prior to Visit  ?Medication Sig  ? benzonatate (TESSALON) 200 MG capsule Take 1 capsule (200 mg total) by mouth 2 (two) times daily as needed for cough. (Patient not taking: Reported on 08/07/2021)  ? Biotin 10 MG CAPS Take 10 mg by mouth daily.   ? Calcium Carb-Cholecalciferol (CALCIUM CARBONATE-VITAMIN D3) 600-400 MG-UNIT TABS Take 1 tablet by mouth 2 (two) times daily.  ? carboxymethylcellulose (REFRESH PLUS) 0.5 % SOLN Place 1 drop into both eyes 4 (four) times daily as needed (dry eyes).   ? cetirizine (ZYRTEC) 10 MG tablet Take 10 mg by mouth daily.  ? Cholecalciferol 25 MCG (1000 UT) tablet Take 1,000 Units by mouth daily.   ? Cranberry 450 MG TABS Take 450 mg by mouth daily.  ? diphenoxylate-atropine (LOMOTIL) 2.5-0.025 MG tablet Take 1 tablet by mouth 4 (four) times daily as needed for diarrhea or loose stools.  ? doxazosin (CARDURA) 2 MG tablet Take 0.5 tablets (1 mg total) by mouth 2 (two) times daily.  ? fluticasone (FLONASE) 50 MCG/ACT nasal spray Use 2 spray(s) in each nostril once daily  ? gabapentin (NEURONTIN) 100 MG capsule Take 1 capsule (100 mg total) by mouth 3 (three) times daily.  ? hydrochlorothiazide (MICROZIDE) 12.5 MG capsule Take 1 capsule by mouth once daily  ? HYDROcodone-acetaminophen (NORCO) 7.5-325 MG tablet Take 1 tablet by mouth every 6 (six) hours as needed for moderate pain or severe pain.  (Patient not taking: Reported on 07/17/2021)  ? ipratropium (ATROVENT) 0.06 % nasal spray Place 2 sprays into both nostrils 2 (two) times daily.  ? irbesartan (AVAPRO) 300  MG tablet Take 1 tablet by mouth once daily  ? metroNIDAZOLE (METROCREAM) 0.75 % cream Apply 1 application topically 2 (two) times daily.  ? montelukast (SINGULAIR) 10 MG tablet Take 1 tablet (10 mg total) by mouth at bedtime.  ? Multiple Vitamin (MULTIVITAMIN) capsule Take 1 capsule by mouth daily.  ? Multiple Vitamins-Minerals (PRESERVISION AREDS 2 PO) Take by mouth.  ? omeprazole (PRILOSEC) 20 MG capsule Take 20 mg by mouth daily.  ? pravastatin (PRAVACHOL) 40 MG tablet Take 1 tablet by mouth once daily  ? Probiotic Product (ALIGN) 4 MG CAPS Take 4 mg by mouth daily.   ? sodium chloride (MURO 128) 5 % ophthalmic ointment Place 1 application into both eyes as needed for eye irritation.   ? traMADol (ULTRAM) 50 MG tablet Take 1 tablet (50 mg total) by mouth every 6 (six) hours as needed for moderate pain. (Patient not taking: Reported on 06/05/2021)  ? zolpidem (AMBIEN) 5 MG tablet Take 5 mg by mouth at bedtime as needed for sleep. (Patient not taking: Reported on 08/07/2021)  ? ?No facility-administered medications prior to visit.  ? ? ?Review of Systems ? ?  ?  Objective  ?  ?There were no vitals taken for this visit. ?BP Readings from Last 3 Encounters:  ?08/07/21 (!) 152/70  ?07/17/21 (!) 147/86  ?06/05/21 136/84  ? ?Wt Readings from Last 3 Encounters:  ?08/07/21 177 lb 11.2 oz (80.6  kg)  ?07/17/21 175 lb 1.6 oz (79.4 kg)  ?01/14/21 176 lb 12.8 oz (80.2 kg)  ? ?  ? ?Physical Exam ?Vitals reviewed.  ?HENT:  ?   Head: Normocephalic and atraumatic.  ?   Right Ear: External ear normal.  ?   Left Ear: External ear normal.  ?Eyes:  ?   General: No scleral icterus. ?   Conjunctiva/sclera: Conjunctivae normal.  ?Neck:  ?   Vascular: No carotid bruit.  ?Cardiovascular:  ?   Rate and Rhythm: Normal rate and regular rhythm.  ?   Heart sounds: Normal heart sounds.  ?Pulmonary:  ?   Effort: Pulmonary effort is normal.  ?   Breath sounds: Normal breath sounds.  ?Chest:  ?Breasts: ?   Right: Normal.  ?   Left: Normal.   ?Abdominal:  ?   Palpations: Abdomen is soft.  ?   Tenderness: There is no right CVA tenderness or left CVA tenderness.  ?   Comments: No abdominal or suprapubic tenderness  ?Lymphadenopathy:  ?   Cervical: No cervical adenopathy.  ?Skin: ?   General: Skin is warm and dry.  ?Neurological:  ?   General: No focal deficit present.  ?   Mental Status: She is alert and oriented to person, place, and time.  ?Psychiatric:     ?   Mood and Affect: Mood normal.     ?   Behavior: Behavior normal.     ?   Thought Content: Thought content normal.     ?   Judgment: Judgment normal.  ?  ? ? ?No results found for any visits on 08/13/21. ? Assessment & Plan  ?  ? ?1. Urinary tract infection without hematuria, site unspecified ?Consider pelvic exam if these persist but this is like once a year presently.  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 ?- nitrofurantoin, macrocrystal-monohydrate, (MACROBID) 100 MG capsule; Take 1 capsule (100 mg total) by mouth 2 (two) times daily.  Dispense: 6 capsule; Refill: 1 ?- CULTURE, URINE COMPREHENSIVE ?- POCT urinalysis dipstick ? ? ?No follow-ups on file.  ?   ? ?I, Wilhemena Durie, MD, have reviewed all documentation for this visit. The documentation on 08/15/21 for the exam, diagnosis, procedures, and orders are all accurate and complete. ? ? ? ?Keatin Benham Cranford Mon, MD  ?[facility] ?[phone] (phone) ?[phone] (fax) ? ?[facility] ?

## 2021-08-15 ENCOUNTER — Encounter: Payer: Self-pay | Admitting: Family Medicine

## 2021-08-16 LAB — CULTURE, URINE COMPREHENSIVE

## 2021-09-17 ENCOUNTER — Other Ambulatory Visit: Payer: Self-pay | Admitting: Family Medicine

## 2021-09-26 DIAGNOSIS — G4733 Obstructive sleep apnea (adult) (pediatric): Secondary | ICD-10-CM | POA: Diagnosis not present

## 2021-10-02 ENCOUNTER — Ambulatory Visit: Payer: Medicare Other | Admitting: Dermatology

## 2021-10-02 ENCOUNTER — Encounter: Payer: Self-pay | Admitting: Dermatology

## 2021-10-02 DIAGNOSIS — L814 Other melanin hyperpigmentation: Secondary | ICD-10-CM

## 2021-10-02 DIAGNOSIS — L719 Rosacea, unspecified: Secondary | ICD-10-CM

## 2021-10-02 DIAGNOSIS — L820 Inflamed seborrheic keratosis: Secondary | ICD-10-CM

## 2021-10-02 DIAGNOSIS — L578 Other skin changes due to chronic exposure to nonionizing radiation: Secondary | ICD-10-CM | POA: Diagnosis not present

## 2021-10-02 DIAGNOSIS — D229 Melanocytic nevi, unspecified: Secondary | ICD-10-CM

## 2021-10-02 DIAGNOSIS — Z1283 Encounter for screening for malignant neoplasm of skin: Secondary | ICD-10-CM | POA: Diagnosis not present

## 2021-10-02 DIAGNOSIS — Z85828 Personal history of other malignant neoplasm of skin: Secondary | ICD-10-CM | POA: Diagnosis not present

## 2021-10-02 DIAGNOSIS — I8393 Asymptomatic varicose veins of bilateral lower extremities: Secondary | ICD-10-CM

## 2021-10-02 DIAGNOSIS — L918 Other hypertrophic disorders of the skin: Secondary | ICD-10-CM

## 2021-10-02 DIAGNOSIS — L821 Other seborrheic keratosis: Secondary | ICD-10-CM

## 2021-10-02 DIAGNOSIS — D1800 Hemangioma unspecified site: Secondary | ICD-10-CM

## 2021-10-02 NOTE — Progress Notes (Signed)
Follow-Up Visit   Subjective  Michelle Lutz is a 81 y.o. female who presents for the following: Annual Exam (Skin cancer screening. Full body. Hx of BCC's of face/nose, treated in [place].). The patient presents for Total-Body Skin Exam (TBSE) for skin cancer screening and mole check.  The patient has spots, moles and lesions to be evaluated, some may be new or changing and the patient has concerns that these could be cancer.  The following portions of the chart were reviewed this encounter and updated as appropriate:  Tobacco  Allergies  Meds  Problems  Med Hx  Surg Hx  Fam Hx     Review of Systems: No other skin or systemic complaints except as noted in HPI or Assessment and Plan.  Objective  Well appearing patient in no apparent distress; mood and affect are within normal limits.  A full examination was performed including scalp, head, eyes, ears, nose, lips, neck, chest, axillae, abdomen, back, buttocks, bilateral upper extremities, bilateral lower extremities, hands, feet, fingers, toes, fingernails, and toenails. All findings within normal limits unless otherwise noted below.  left cheek x1 Erythematous keratotic or waxy stuck-on papule or plaque.  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  - Call if any new or changing lesions are noted between office visits   Lentigines - Scattered tan macules - Due to sun exposure - Benign-appearing, observe - Recommend daily broad spectrum sunscreen SPF 30+ to sun-exposed areas, reapply every 2 hours as needed. - Call for any changes  Seborrheic Keratoses - Stuck-on, waxy, tan-brown papules and/or plaques  - Benign-appearing - Discussed benign etiology and  prognosis. - Observe - Call for any changes  Melanocytic Nevi - Tan-brown and/or pink-flesh-colored symmetric macules and papules - Benign appearing on exam today - Observation - Call clinic for new or changing moles - Recommend daily use of broad spectrum spf 30+ sunscreen to sun-exposed areas.   Hemangiomas - Red papules - Discussed benign nature - Observe - Call for any changes  Actinic Damage - Chronic condition, secondary to cumulative UV/sun exposure - diffuse scaly erythematous macules with underlying dyspigmentation - Recommend daily broad spectrum sunscreen SPF 30+ to sun-exposed areas, reapply every 2 hours as needed.  - Staying in the shade or wearing long sleeves, sun glasses (UVA+UVB protection) and wide brim hats (4-inch brim around the entire circumference of the hat) are also recommended for sun protection.  - Call for new or changing lesions.  Skin cancer screening performed today.  Inflamed seborrheic keratosis left cheek x1 Irritated.  Patient would like treatment. 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 It is treatable but not curable. It sometimes affects the eyes (ocular rosacea) as well. It may respond to  topical and/or systemic medication and can flare with stress, sun exposure, alcohol, exercise and some foods.  Daily application of broad spectrum spf 30+ sunscreen to face is recommended to reduce flares.  Patient  deferred treatment at this time.  Skin cancer screening  Acrochordons (Skin Tags) - Fleshy, skin-colored pedunculated papules - Benign appearing.  - Observe. - If desired, they can be removed with an in office procedure that is not covered by insurance. - Please call the clinic if you notice any new or changing lesions.   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 Larger vessels may be covered if symptomatic and we would refer to vascular surgeon if treatment desired.  Return in about 1 year (around 10/03/2022) for TBSE, HxBCC's.  I, Emelia Salisbury, CMA, am acting as scribe for Sarina Ser, MD. Documentation: I have reviewed the above documentation for accuracy and completeness, and I agree with the above.  Sarina Ser, MD

## 2021-10-02 NOTE — Patient Instructions (Signed)
Cryotherapy Aftercare ? ?Wash gently with soap and water everyday.   ?Apply Vaseline and Band-Aid daily until healed.  ? ?Prior to procedure, discussed risks of blister formation, small wound, skin dyspigmentation, or rare scar following cryotherapy. Recommend Vaseline ointment to treated areas while healing.  ? ? ?Melanoma ABCDEs ? ?Melanoma is the most dangerous type of skin cancer, and is the leading cause of death from skin disease.  You are more likely to develop melanoma if you: ?Have light-colored skin, light-colored eyes, or red or blond hair ?Spend a lot of time in the sun ?Tan regularly, either outdoors or in a tanning bed ?Have had blistering sunburns, especially during childhood ?Have a close family member who has had a melanoma ?Have atypical moles or large birthmarks ? ?Early detection of melanoma is key since treatment is typically straightforward and cure rates are extremely high if we catch it early.  ? ?The first sign of melanoma is often a change in a mole or a new dark spot.  The ABCDE system is a way of remembering the signs of melanoma. ? ?A for asymmetry:  The two halves do not match. ?B for border:  The edges of the growth are irregular. ?C for color:  A mixture of colors are present instead of an even brown color. ?D for diameter:  Melanomas are usually (but not always) greater than 65m - the size of a pencil eraser. ?E for evolution:  The spot keeps changing in size, shape, and color. ? ?Please check your skin once per month between visits. You can use a small mirror in front and a large mirror behind you to keep an eye on the back side or your body.  ? ?If you see any new or changing lesions before your next follow-up, please call to schedule a visit. ? ?Please continue daily skin protection including broad spectrum sunscreen SPF 30+ to sun-exposed areas, reapplying every 2 hours as needed when you're outdoors.  ? ?Staying in the shade or wearing long sleeves, sun glasses (UVA+UVB  protection) and wide brim hats (4-inch brim around the entire circumference of the hat) are also recommended for sun protection.   ? ? ?If You Need Anything After Your Visit ? ?If you have any questions or concerns for your doctor, please call our main line at [phone] press option 4 to reach your doctor's medical assistant. If no one answers, please leave a voicemail as directed and we will return your call as soon as possible. Messages left after 4 pm will be answered the following business day.  ? ?You may also send [place] message via MyChart. We typically respond to MyChart messages within 1-2 business days. ? ?For prescription refills, please ask your pharmacy to contact our office. Our fax number is [phone] ? ?If you have an urgent issue when the clinic is closed that cannot wait until the next business day, you can page your doctor at the number below.   ? ?Please note that while we do our best to be available for urgent issues outside of office hours, we are not available 24/7.  ? ?If you have an urgent issue and are unable to reach [place] you may choose to seek medical care at your doctor's office, retail clinic, urgent care center, or emergency room. ? ?If you have a medical emergency, please immediately call 911 or go to the emergency department. ? ?Pager Numbers ? ?- Dr. KNehemiah Massed [phone]? ?- Dr. MLaurence Ferrari: [phone]? ?- Dr.  Nicole Kindred: [phone] ? ?In the event of inclement weather, please call our main line at [phone] for an update on the status of any delays or closures. ? ?Dermatology Medication Tips: ?Please keep the boxes that topical medications come in in order to help keep track of the instructions about where and how to use these. Pharmacies typically print the medication instructions only on the boxes and not directly on the medication tubes.  ? ?If your medication is too expensive, please contact our office at [phone] option 4 or send [place] a message through [place].   ? ?We are unable to tell what your co-pay for medications will be in advance as this is different depending on your insurance coverage. However, we may be able to find a substitute medication at lower cost or fill out paperwork to get insurance to cover a needed medication.  ? ?If a prior authorization is required to get your medication covered by your insurance company, please allow [place] 1-2 business days to complete this process. ? ?Drug prices often vary depending on where the prescription is filled and some pharmacies may offer cheaper prices. ? ?The website www.goodrx.com contains coupons for medications through different pharmacies. The prices here do not account for what the cost may be with help from insurance (it may be cheaper with your insurance), but the website can give you the price if you did not use any insurance.  ?- You can print the associated coupon and take it with your prescription to the pharmacy.  ?- You may also stop by our office during regular business hours and pick up a GoodRx coupon card.  ?- If you need your prescription sent electronically to a different pharmacy, notify our office through [facility] or by phone at [phone] option 4. ? ? ? ? ?Si Usted Necesita Algo Despu?s de Su Visita ? ?Tambi?n puede enviarnos un mensaje a trav?s de MyChart. Por lo general respondemos a los mensajes de MyChart en el transcurso de 1 a 2 d?as h?biles. ? ?Para renovar recetas, por favor pida a su farmacia que se ponga en contacto con nuestra oficina. Nuestro n?mero de fax es el [phone]. ? ?Si tiene un asunto urgente cuando la cl?nica est? cerrada y que no puede esperar hasta el siguiente d?a h?bil, puede llamar/localizar a su doctor(a) al n?mero que aparece a continuaci?n.  ? ?Por favor, tenga en cuenta que aunque hacemos todo lo posible para estar disponibles para asuntos urgentes fuera del horario de oficina, no estamos disponibles las 24 horas del d?a, los 7 d?as de la semana.   ? ?Si tiene un problema urgente y no puede comunicarse con nosotros, puede optar por buscar atenci?n m?dica  en el consultorio de su doctor(a), en una cl?nica privada, en un centro de atenci?n urgente o en una sala de emergencias. ? ?Si tiene Engineer, maintenance (IT) m?dica, por favor llame inmediatamente al 911 o vaya a la sala de emergencias. ? ?N?meros de b?per ? ?- Dr. Nehemiah Massed: [phone] ? ?- Dra. Moye: [phone] ? ?- Dra. Nicole Kindred: [phone] ? ?En caso de inclemencias del tiempo, por favor llame a nuestra l?nea principal al [phone] para una actualizaci?n sobre el estado de cualquier retraso o cierre. ? ?Consejos para la medicaci?n en dermatolog?a: ?Por favor, guarde las cajas en las que vienen los medicamentos de uso t?pico para ayudarle a seguir las instrucciones sobre d?nde y c?mo usarlos. Las farmacias generalmente imprimen las instrucciones del medicamento s?lo en las cajas y no directamente en los tubos del  medicamento.  ? ?Si su medicamento es muy caro, por favor, p?ngase en contacto con Zigmund Daniel llamando al [phone] y presione la opci?n 4 o env?enos un mensaje a trav?s de MyChart.  ? ?No podemos decirle cu?l ser? su copago por los medicamentos por adelantado ya que esto es diferente dependiendo de la cobertura de su seguro. Sin embargo, es posible que podamos encontrar un medicamento sustituto a Electrical engineer un formulario para que el seguro cubra el medicamento que se considera necesario.  ? ?Si se requiere Ardelia Mems autorizaci?n previa para que su compa??a de seguros [place] su medicamento, por favor perm?tanos de 1 a 2 d?as h?biles para completar este proceso. ? ?Los precios de los medicamentos var?an con frecuencia dependiendo del Environmental consultant de d?nde se surte la receta y alguna farmacias pueden ofrecer precios m?s baratos. ? ?El sitio web www.goodrx.com tiene cupones para medicamentos de Airline pilot. Los precios aqu? no tienen en cuenta lo que podr?a costar con la ayuda del seguro  (puede ser m?s barato con su seguro), pero el sitio web puede darle el precio si no utiliz? ning?n seguro.  ?- Puede imprimir el cup?n correspondiente y llevarlo con su receta a la farmacia.  ?- Tambi?n puede pasar po

## 2021-10-15 ENCOUNTER — Encounter: Payer: Self-pay | Admitting: Dermatology

## 2021-11-30 NOTE — Progress Notes (Signed)
Cardiology Office Note  Date:  11/30/2021   ID:  Michelle Lutz, DOB Dec 09, 1940, MRN [id number]  PCP:  Jerrol Banana., MD   No chief complaint on file.   HPI:  [name] is a 81 year old woman with past medical history of Hypertension Spinal stenosis Hyperlipidemia OSA on CPAP Cath 2003: no CAD Syncope, vasovagal (better off verapamil) F/u of her hypertension, near syncope syncope  LOV 7/22   history of near syncope and syncope Couple episodes of near syncope in AM Some measurements systolic pressure 614, 431  Take meds in AM,  Near syncope before am meds Longstanding chronic issue, previously with syncope as detailed below  No regular exercise program but stays active  Does not drink much fluids Orthostatics done in the office today were negative, most of her pressures 150-1 60  EKG personally reviewed by myself on todays visit Normal sinus rhythm rate 63 bpm no significant ST-T wave changes  Past medical history reviewed Episode last week of near syncope, was cooking, 02/28/2019 was a "big one" Other episodes with dates below 02/08/18 10/21/17 Jan 2019 Reports that some of her episodes, systolic pressures in the 70s Symptoms have improved by holding verapamil, now does not have many episodes and not as severe    Lab Results  Component Value Date   CHOL 185 01/14/2021   HDL 61 01/14/2021   LDLCALC 109 (H) 01/14/2021   TRIG 83 01/14/2021    PMH:   has a past medical history of Allergy, Arthritis, Basal cell carcinoma, Cataract, GERD (gastroesophageal reflux disease), Hypertension, Osteoarthritis, Sleep apnea, and Spinal stenosis.  PSH:    Past Surgical History:  Procedure Laterality Date   BREAST CYST ASPIRATION     20+ years   BREAST EXCISIONAL BIOPSY Right    1980's   CESAREAN SECTION     EYE SURGERY     FOOT SURGERY     JOINT REPLACEMENT Right 07/07/2016   Partial R. knee in 2008 then complete in 2018   uterine prolapse repair      Tacking uterus    Current Outpatient Medications  Medication Sig Dispense Refill   Biotin 10 MG CAPS Take 10 mg by mouth daily.      Calcium Carb-Cholecalciferol (CALCIUM CARBONATE-VITAMIN D3) 600-400 MG-UNIT TABS Take 1 tablet by mouth 2 (two) times daily.     carboxymethylcellulose (REFRESH PLUS) 0.5 % SOLN Place 1 drop into both eyes 4 (four) times daily as needed (dry eyes).      cetirizine (ZYRTEC) 10 MG tablet Take 10 mg by mouth daily.     Cholecalciferol 25 MCG (1000 UT) tablet Take 1,000 Units by mouth daily.      Cranberry 450 MG TABS Take 450 mg by mouth daily.     diphenoxylate-atropine (LOMOTIL) 2.5-0.025 MG tablet Take 1 tablet by mouth 4 (four) times daily as needed for diarrhea or loose stools.     doxazosin (CARDURA) 2 MG tablet Take 0.5 tablets (1 mg total) by mouth 2 (two) times daily. 90 tablet 3   fluticasone (FLONASE) 50 MCG/ACT nasal spray Use 2 spray(s) in each nostril once daily 16 g 0   gabapentin (NEURONTIN) 100 MG capsule Take 1 capsule (100 mg total) by mouth 3 (three) times daily. 270 capsule 2   hydrochlorothiazide (MICROZIDE) 12.5 MG capsule Take 1 capsule by mouth once daily 90 capsule 3   HYDROcodone-acetaminophen (NORCO) 7.5-325 MG tablet Take 1 tablet by mouth every 6 (six) hours as needed for moderate  pain or severe pain.  (Patient not taking: Reported on 07/17/2021)     ipratropium (ATROVENT) 0.06 % nasal spray Place 2 sprays into both nostrils 2 (two) times daily. 15 mL 5   irbesartan (AVAPRO) 300 MG tablet Take 1 tablet by mouth once daily 90 tablet 3   metroNIDAZOLE (METROCREAM) 0.75 % cream Apply 1 application topically 2 (two) times daily.     montelukast (SINGULAIR) 10 MG tablet Take 1 tablet (10 mg total) by mouth at bedtime. 90 tablet 3   Multiple Vitamin (MULTIVITAMIN) capsule Take 1 capsule by mouth daily.     Multiple Vitamins-Minerals (PRESERVISION AREDS 2 PO) Take by mouth.     nitrofurantoin, macrocrystal-monohydrate, (MACROBID) 100 MG capsule  Take 1 capsule (100 mg total) by mouth 2 (two) times daily. 6 capsule 1   omeprazole (PRILOSEC) 20 MG capsule Take 20 mg by mouth daily.     pravastatin (PRAVACHOL) 40 MG tablet Take 1 tablet by mouth once daily 90 tablet 3   Probiotic Product (ALIGN) 4 MG CAPS Take 4 mg by mouth daily.      sodium chloride (MURO 128) 5 % ophthalmic ointment Place 1 application into both eyes as needed for eye irritation.      traMADol (ULTRAM) 50 MG tablet Take 1 tablet (50 mg total) by mouth every 6 (six) hours as needed for moderate pain. (Patient not taking: Reported on 06/05/2021) 30 tablet 2   zolpidem (AMBIEN) 5 MG tablet Take 5 mg by mouth at bedtime as needed for sleep.     No current facility-administered medications for this visit.     Allergies:   Aspirin, Benazepril, and Zocor [simvastatin]   Social History:  The patient  reports that she has never smoked. She has never used smokeless tobacco.  No alcohol use . she reports current drug use.   Family History:   family history includes Arthritis in her brother and father; COPD in her father; Heart disease in her brother, father, and mother; Hypertension in her brother; Kidney disease in her mother; Macular degeneration in her mother.    Review of Systems: Review of Systems  Constitutional: Negative.   HENT: Negative.    Respiratory: Negative.    Cardiovascular: Negative.   Gastrointestinal: Negative.   Musculoskeletal: Negative.   Neurological:  Positive for dizziness.  Psychiatric/Behavioral: Negative.    All other systems reviewed and are negative.   PHYSICAL EXAM: VS:  There were no vitals taken for this visit. , BMI There is no height or weight on file to calculate BMI. Constitutional:  oriented to person, place, and time. No distress.  HENT:  Head: Grossly normal Eyes:  no discharge. No scleral icterus.  Neck: No JVD, no carotid bruits  Cardiovascular: Regular rate and rhythm, no murmurs appreciated Pulmonary/Chest: Clear to  auscultation bilaterally, no wheezes or rails Abdominal: Soft.  no distension.  no tenderness.  Musculoskeletal: Normal range of motion Neurological:  normal muscle tone. Coordination normal. No atrophy Skin: Skin warm and dry Psychiatric: normal affect, pleasant   Recent Labs: 01/14/2021: ALT 12; BUN 15; Creatinine, Ser 0.81; Hemoglobin 12.4; Platelets 215; Potassium 4.1; Sodium 140; TSH 4.290    Lipid Panel Lab Results  Component Value Date   CHOL 185 01/14/2021   HDL 61 01/14/2021   LDLCALC 109 (H) 01/14/2021   TRIG 83 01/14/2021      Wt Readings from Last 3 Encounters:  08/07/21 177 lb 11.2 oz (80.6 kg)  07/17/21 175 lb 1.6 oz (79.4 kg)  01/14/21 176 lb 12.8 oz (80.2 kg)       ASSESSMENT AND PLAN:  Problem List Items Addressed This Visit   None Syncope/near syncope Long history previous work-up with cardiology Recommend she change doxazosin to 1 mg twice daily from 2 mg in evening Only take HCTZ for blood pressure over 140 Drink a large glass of water first thing in the morning before her hot coffee She knows to sit or lay down if she has near syncope symptoms Several of these episodes blood pressure was not particularly low when she was sitting in recovery  less likely arrhythmia  Essential hypertension Changes as above for near syncope  Hyperlipidemia Tolerating pravastatin No ischemic work-up needed at this time  Obstructive sleep apnea On CPAP    Total encounter time more than 25 minutes  Greater than 50% was spent in counseling and coordination of care with the patient    Signed, Esmond Plants, M.D., Ph.D. [place], [place]

## 2021-12-02 ENCOUNTER — Encounter: Payer: Self-pay | Admitting: Cardiovascular Disease

## 2021-12-02 ENCOUNTER — Ambulatory Visit: Payer: Medicare Other | Admitting: Cardiovascular Disease

## 2021-12-02 VITALS — BP 140/80 | HR 72 | Ht 64.0 in | Wt 174.0 lb

## 2021-12-02 DIAGNOSIS — I10 Essential (primary) hypertension: Secondary | ICD-10-CM

## 2021-12-02 DIAGNOSIS — G4733 Obstructive sleep apnea (adult) (pediatric): Secondary | ICD-10-CM

## 2021-12-02 DIAGNOSIS — R55 Syncope and collapse: Secondary | ICD-10-CM | POA: Diagnosis not present

## 2021-12-02 DIAGNOSIS — E782 Mixed hyperlipidemia: Secondary | ICD-10-CM

## 2021-12-02 DIAGNOSIS — Z9989 Dependence on other enabling machines and devices: Secondary | ICD-10-CM

## 2021-12-02 NOTE — Patient Instructions (Signed)
Medication Instructions:  We will change cardura/doxazosin to the 1 mg pills twice a day   If you need a refill on your cardiac medications before your next appointment, please call your pharmacy.   Lab work: No new labs needed  Testing/Procedures: No new testing needed  Follow-Up: At [facility], you and your health needs are our priority.  As part of our continuing mission to provide you with exceptional heart care, we have created designated Provider Care Teams.  These Care Teams include your primary Cardiologist (physician) and Advanced Practice Providers (APPs -  Physician Assistants and Nurse Practitioners) who all work together to provide you with the care you need, when you need it.  You will need a follow up appointment in 12 months  Providers on your designated Care Team:   Murray Hodgkins, NP Christell Faith, PA-C Cadence Kathlen Mody, [place]  COVID-19 Vaccine Information can be found at: ShippingScam.co.uk For questions related to vaccine distribution or appointments, please email [email or web address] .[email or web address] or call [phone].

## 2021-12-03 ENCOUNTER — Other Ambulatory Visit: Payer: Self-pay | Admitting: Family Medicine

## 2021-12-23 ENCOUNTER — Other Ambulatory Visit: Payer: Self-pay | Admitting: Family Medicine

## 2021-12-23 DIAGNOSIS — Z1231 Encounter for screening mammogram for malignant neoplasm of breast: Secondary | ICD-10-CM

## 2021-12-26 DIAGNOSIS — H353132 Nonexudative age-related macular degeneration, bilateral, intermediate dry stage: Secondary | ICD-10-CM | POA: Diagnosis not present

## 2021-12-26 DIAGNOSIS — H43812 Vitreous degeneration, left eye: Secondary | ICD-10-CM | POA: Diagnosis not present

## 2021-12-26 DIAGNOSIS — Z961 Presence of intraocular lens: Secondary | ICD-10-CM | POA: Diagnosis not present

## 2021-12-26 DIAGNOSIS — H04123 Dry eye syndrome of bilateral lacrimal glands: Secondary | ICD-10-CM | POA: Diagnosis not present

## 2022-01-08 ENCOUNTER — Other Ambulatory Visit: Payer: Self-pay | Admitting: *Deleted

## 2022-01-08 DIAGNOSIS — G4733 Obstructive sleep apnea (adult) (pediatric): Secondary | ICD-10-CM

## 2022-01-08 DIAGNOSIS — E6609 Other obesity due to excess calories: Secondary | ICD-10-CM

## 2022-01-08 DIAGNOSIS — E782 Mixed hyperlipidemia: Secondary | ICD-10-CM

## 2022-01-08 DIAGNOSIS — I10 Essential (primary) hypertension: Secondary | ICD-10-CM

## 2022-01-08 DIAGNOSIS — Z1329 Encounter for screening for other suspected endocrine disorder: Secondary | ICD-10-CM | POA: Diagnosis not present

## 2022-01-08 DIAGNOSIS — Z0000 Encounter for general adult medical examination without abnormal findings: Secondary | ICD-10-CM

## 2022-01-08 DIAGNOSIS — K219 Gastro-esophageal reflux disease without esophagitis: Secondary | ICD-10-CM

## 2022-01-08 DIAGNOSIS — M8580 Other specified disorders of bone density and structure, unspecified site: Secondary | ICD-10-CM

## 2022-01-09 LAB — LIPID PANEL
Chol/HDL Ratio: 2.4 ratio (ref 0.0–4.4)
Cholesterol, Total: 167 mg/dL (ref 100–199)
HDL: 69 mg/dL (ref >39)
LDL Chol Calc (NIH): 85 mg/dL (ref 0–99)
Triglycerides: 66 mg/dL (ref 0–149)
VLDL Cholesterol Cal: 13 mg/dL (ref 5–40)

## 2022-01-09 LAB — CBC WITH DIFFERENTIAL/PLATELET
Basophils Absolute: 0 10*3/uL (ref 0.0–0.2)
Basos: 1 %
EOS (ABSOLUTE): 0.2 10*3/uL (ref 0.0–0.4)
Eos: 3 %
Hematocrit: 35.6 % (ref 34.0–46.6)
Hemoglobin: 12.3 g/dL (ref 11.1–15.9)
Immature Grans (Abs): 0 10*3/uL (ref 0.0–0.1)
Immature Granulocytes: 0 %
Lymphocytes Absolute: 2 10*3/uL (ref 0.7–3.1)
Lymphs: 42 %
MCH: 32.6 pg (ref 26.6–33.0)
MCHC: 34.6 g/dL (ref 31.5–35.7)
MCV: 94 fL (ref 79–97)
Monocytes Absolute: 0.5 10*3/uL (ref 0.1–0.9)
Monocytes: 11 %
Neutrophils Absolute: 2 10*3/uL (ref 1.4–7.0)
Neutrophils: 43 %
Platelets: 212 10*3/uL (ref 150–450)
RBC: 3.77 x10E6/uL (ref 3.77–5.28)
RDW: 12.2 % (ref 11.7–15.4)
WBC: 4.7 10*3/uL (ref 3.4–10.8)

## 2022-01-09 LAB — COMPREHENSIVE METABOLIC PANEL
ALT: 13 IU/L (ref 0–32)
AST: 22 IU/L (ref 0–40)
Albumin/Globulin Ratio: 1.9 (ref 1.2–2.2)
Albumin: 4.3 g/dL (ref 3.8–4.8)
Alkaline Phosphatase: 74 IU/L (ref 44–121)
BUN/Creatinine Ratio: 19 (ref 12–28)
BUN: 15 mg/dL (ref 8–27)
Bilirubin Total: 0.5 mg/dL (ref 0.0–1.2)
CO2: 25 mmol/L (ref 20–29)
Calcium: 9.5 mg/dL (ref 8.7–10.3)
Chloride: 100 mmol/L (ref 96–106)
Creatinine, Ser: 0.78 mg/dL (ref 0.57–1.00)
Globulin, Total: 2.3 g/dL (ref 1.5–4.5)
Glucose: 98 mg/dL (ref 70–99)
Potassium: 4.4 mmol/L (ref 3.5–5.2)
Sodium: 138 mmol/L (ref 134–144)
Total Protein: 6.6 g/dL (ref 6.0–8.5)
eGFR: 77 mL/min/{1.73_m2} (ref >59)

## 2022-01-09 LAB — TSH: TSH: 4.1 u[IU]/mL (ref 0.450–4.500)

## 2022-01-15 DIAGNOSIS — H6123 Impacted cerumen, bilateral: Secondary | ICD-10-CM | POA: Diagnosis not present

## 2022-01-15 DIAGNOSIS — H903 Sensorineural hearing loss, bilateral: Secondary | ICD-10-CM | POA: Diagnosis not present

## 2022-01-16 ENCOUNTER — Encounter: Payer: Medicare Other | Admitting: Family Medicine

## 2022-01-16 ENCOUNTER — Encounter: Payer: Self-pay | Admitting: Family Medicine

## 2022-01-16 ENCOUNTER — Ambulatory Visit (INDEPENDENT_AMBULATORY_CARE_PROVIDER_SITE_OTHER): Payer: Medicare Other | Admitting: Family Medicine

## 2022-01-16 VITALS — BP 126/75 | HR 69 | Temp 97.9°F | Resp 16 | Ht 64.0 in | Wt 176.9 lb

## 2022-01-16 DIAGNOSIS — N390 Urinary tract infection, site not specified: Secondary | ICD-10-CM

## 2022-01-16 DIAGNOSIS — Z0000 Encounter for general adult medical examination without abnormal findings: Secondary | ICD-10-CM

## 2022-01-16 DIAGNOSIS — M4726 Other spondylosis with radiculopathy, lumbar region: Secondary | ICD-10-CM

## 2022-01-16 DIAGNOSIS — R053 Chronic cough: Secondary | ICD-10-CM

## 2022-01-16 MED ORDER — NITROFURANTOIN MONOHYD MACRO 100 MG PO CAPS: 100.0000 mg | ORAL_CAPSULE | Freq: Two times a day (BID) | ORAL | 2 refills | Status: DC

## 2022-01-16 NOTE — Progress Notes (Signed)
I,Sulibeya S Dimas,acting as a scribe for Wilhemena Durie, MD.,have documented all relevant documentation on the behalf of Wilhemena Durie, MD,as directed by  Wilhemena Durie, MD while in the presence of Wilhemena Durie, MD.   Complete physical exam   Patient: Michelle Lutz   DOB: March 08, 1941   81 y.o. Female  MRN: [id number] Visit Date: 01/16/2022  Today's healthcare provider: Wilhemena Durie, MD   Chief Complaint  Patient presents with   Annual Exam   Subjective    Michelle Lutz is a 81 y.o. female who presents today for a complete physical exam.  She reports consuming a general diet. The patient does not participate in regular exercise at present. She generally feels well. She reports sleeping well. She does not have additional problems to discuss today.  HPI  Patient had AWV with [facility] on 08/07/2021.  Past Medical History:  Diagnosis Date   Allergy    Arthritis    Basal cell carcinoma    left temple, right temple, forehead, nose- removed in [place]    Cataract    GERD (gastroesophageal reflux disease)    Hypertension    Osteoarthritis    Sleep apnea    Spinal stenosis    Past Surgical History:  Procedure Laterality Date   BREAST CYST ASPIRATION     20+ years   BREAST EXCISIONAL BIOPSY Right    1980's   CESAREAN SECTION     EYE SURGERY     FOOT SURGERY     JOINT REPLACEMENT Right 07/07/2016   Partial R. knee in 2008 then complete in 2018   uterine prolapse repair     Tacking uterus   Social History   Socioeconomic History   Marital status: Married    Spouse name: Not on file   Number of children: 2   Years of education: Not on file   Highest education level: Not on file  Occupational History   Occupation: Surveyor, quantity: [employer]    Comment: for 35 years  Tobacco Use   Smoking status: Never   Smokeless tobacco: Never  Vaping Use   Vaping Use: Never used  Substance and Sexual Activity   Alcohol  use: Not Currently   Drug use: Yes    Comment: prescribed hydrocodone   Sexual activity: Not on file  Other Topics Concern   Not on file  Social History Narrative   Not on file   Social Determinants of Health   Financial Resource Strain: Low Risk  (08/07/2021)   Overall Financial Resource Strain (CARDIA)    Difficulty of Paying Living Expenses: Not hard at all  Food Insecurity: No Food Insecurity (08/07/2021)   Hunger Vital Sign    Worried About Running Out of Food in the Last Year: Never true    Ran Out of Food in the Last Year: Never true  Transportation Needs: No Transportation Needs (08/07/2021)   PRAPARE - Hydrologist (Medical): No    Lack of Transportation (Non-Medical): No  Physical Activity: Insufficiently Active (08/07/2021)   Exercise Vital Sign    Days of Exercise per Week: 2 days    Minutes of Exercise per Session: 20 min  Stress: No Stress Concern Present (08/07/2021)   [place]    Feeling of Stress : Only a little  Social Connections: Moderately Integrated (08/07/2021)   Social Connection  and Isolation Panel [NHANES]    Frequency of Communication with Friends and Family: More than three times a week    Frequency of Social Gatherings with Friends and Family: More than three times a week    Attends Religious Services: More than 4 times per year    Active Member of Clubs or Organizations: No    Attends Archivist Meetings: Never    Marital Status: Married  Human resources officer Violence: Not At Risk (08/07/2021)   Humiliation, Afraid, Rape, and Kick questionnaire    Fear of Current or Ex-Partner: No    Emotionally Abused: No    Physically Abused: No    Sexually Abused: No   Family Status  Relation Name Status   Mother  Deceased   Father  Deceased   Brother  (Not Specified)   Neg Hx  (Not Specified)   Family History  Problem Relation Age of Onset   Heart  disease Mother    Kidney disease Mother    Macular degeneration Mother    Arthritis Father    Heart disease Father    COPD Father    Arthritis Brother    Heart disease Brother    Hypertension Brother    Breast cancer Neg Hx    Allergies  Allergen Reactions   Aspirin    Benazepril Cough   Zocor [Simvastatin]     Caused pain    Patient Care Team: Jerrol Banana., MD as PCP - General (Family Medicine)   Medications: Outpatient Medications Prior to Visit  Medication Sig   Biotin 10 MG CAPS Take 10 mg by mouth daily.    Calcium Carb-Cholecalciferol (CALCIUM CARBONATE-VITAMIN D3) 600-400 MG-UNIT TABS Take 1 tablet by mouth 2 (two) times daily.   carboxymethylcellulose (REFRESH PLUS) 0.5 % SOLN Place 1 drop into both eyes 4 (four) times daily as needed (dry eyes).    cetirizine (ZYRTEC) 10 MG tablet Take 10 mg by mouth daily.   Cholecalciferol 25 MCG (1000 UT) tablet Take 1,000 Units by mouth daily.    Cranberry 450 MG TABS Take 450 mg by mouth daily.   diphenoxylate-atropine (LOMOTIL) 2.5-0.025 MG tablet Take 1 tablet by mouth 4 (four) times daily as needed for diarrhea or loose stools.   doxazosin (CARDURA) 2 MG tablet Take 0.5 tablets (1 mg total) by mouth 2 (two) times daily.   fluticasone (FLONASE) 50 MCG/ACT nasal spray Use 2 spray(s) in each nostril once daily   gabapentin (NEURONTIN) 100 MG capsule Take 1 capsule (100 mg total) by mouth 3 (three) times daily.   hydrochlorothiazide (MICROZIDE) 12.5 MG capsule Take 1 capsule by mouth once daily   HYDROcodone-acetaminophen (NORCO) 7.5-325 MG tablet Take 1 tablet by mouth every 6 (six) hours as needed for moderate pain or severe pain.   ipratropium (ATROVENT) 0.06 % nasal spray Place 2 sprays into both nostrils 2 (two) times daily.   irbesartan (AVAPRO) 300 MG tablet Take 1 tablet by mouth once daily   metroNIDAZOLE (METROCREAM) 0.75 % cream Apply 1 application topically 2 (two) times daily.   montelukast (SINGULAIR) 10 MG  tablet Take 1 tablet (10 mg total) by mouth at bedtime.   Multiple Vitamin (MULTIVITAMIN) capsule Take 1 capsule by mouth daily.   Multiple Vitamins-Minerals (PRESERVISION AREDS 2 PO) Take by mouth.   omeprazole (PRILOSEC) 20 MG capsule Take 20 mg by mouth daily.   pravastatin (PRAVACHOL) 40 MG tablet Take 1 tablet by mouth once daily   Probiotic Product (ALIGN) 4 MG  CAPS Take 4 mg by mouth daily.    sodium chloride (MURO 128) 5 % ophthalmic ointment Place 1 application into both eyes as needed for eye irritation.    zolpidem (AMBIEN) 5 MG tablet Take 5 mg by mouth at bedtime as needed for sleep.   No facility-administered medications prior to visit.    Review of Systems  HENT:  Positive for postnasal drip and rhinorrhea.   Musculoskeletal:  Positive for arthralgias and back pain.  All other systems reviewed and are negative.   Last CBC Lab Results  Component Value Date   WBC 4.7 01/08/2022   HGB 12.3 01/08/2022   HCT 35.6 01/08/2022   MCV 94 01/08/2022   MCH 32.6 01/08/2022   RDW 12.2 01/08/2022   PLT 212 08/81/1031   Last metabolic panel Lab Results  Component Value Date   GLUCOSE 98 01/08/2022   NA 138 01/08/2022   K 4.4 01/08/2022   CL 100 01/08/2022   CO2 25 01/08/2022   BUN 15 01/08/2022   CREATININE 0.78 01/08/2022   EGFR 77 01/08/2022   CALCIUM 9.5 01/08/2022   PROT 6.6 01/08/2022   ALBUMIN 4.3 01/08/2022   LABGLOB 2.3 01/08/2022   AGRATIO 1.9 01/08/2022   BILITOT 0.5 01/08/2022   ALKPHOS 74 01/08/2022   AST 22 01/08/2022   ALT 13 01/08/2022   ANIONGAP 10 03/01/2019   Last lipids Lab Results  Component Value Date   CHOL 167 01/08/2022   HDL 69 01/08/2022   LDLCALC 85 01/08/2022   TRIG 66 01/08/2022   CHOLHDL 2.4 01/08/2022    Last thyroid functions Lab Results  Component Value Date   TSH 4.100 01/08/2022      Objective     BP 126/75 (BP Location: Right Arm, Patient Position: Sitting, Cuff Size: Large)   Pulse 69   Temp 97.9 F (36.6 C)  (Oral)   Resp 16   Ht 5' 4"  (1.626 m)   Wt 176 lb 14.4 oz (80.2 kg)   SpO2 99%   BMI 30.36 kg/m  BP Readings from Last 3 Encounters:  01/16/22 126/75  12/02/21 140/80  08/07/21 (!) 152/70   Wt Readings from Last 3 Encounters:  01/16/22 176 lb 14.4 oz (80.2 kg)  12/02/21 174 lb (78.9 kg)  08/07/21 177 lb 11.2 oz (80.6 kg)       Physical Exam Vitals reviewed.  HENT:     Head: Normocephalic and atraumatic.     Right Ear: External ear normal.     Left Ear: External ear normal.     Mouth/Throat:     Pharynx: Oropharynx is clear.  Eyes:     General: No scleral icterus.    Conjunctiva/sclera: Conjunctivae normal.  Neck:     Vascular: No carotid bruit.  Cardiovascular:     Rate and Rhythm: Normal rate and regular rhythm.     Heart sounds: Normal heart sounds.  Pulmonary:     Effort: Pulmonary effort is normal.     Breath sounds: Normal breath sounds.  Chest:  Breasts:    Right: Normal.     Left: Normal.  Abdominal:     Palpations: Abdomen is soft.     Tenderness: There is no right CVA tenderness or left CVA tenderness.  Lymphadenopathy:     Cervical: No cervical adenopathy.  Skin:    General: Skin is warm and dry.  Neurological:     General: No focal deficit present.     Mental Status: She is alert and  oriented to person, place, and time.  Psychiatric:        Mood and Affect: Mood normal.        Behavior: Behavior normal.        Thought Content: Thought content normal.        Judgment: Judgment normal.       Last depression screening scores    01/16/2022   10:38 AM 08/07/2021    9:38 AM 07/17/2021    9:23 AM  PHQ 2/9 Scores  PHQ - 2 Score 0 0 0  PHQ- 9 Score 0     Last fall risk screening    01/16/2022   10:38 AM  Fall Risk   Falls in the past year? 0  Number falls in past yr: 0  Injury with Fall? 0  Risk for fall due to : No Fall Risks  Follow up Falls evaluation completed   Last Audit-C alcohol use screening    01/16/2022   10:39 AM  Alcohol  Use Disorder Test (AUDIT)  1. How often do you have a drink containing alcohol? 0  2. How many drinks containing alcohol do you have on a typical day when you are drinking? 0  3. How often do you have six or more drinks on one occasion? 0  AUDIT-C Score 0   A score of 3 or more in women, and 4 or more in men indicates increased risk for alcohol abuse, EXCEPT if all of the points are from question 1   No results found for any visits on 01/16/22.  Assessment & Plan    Routine Health Maintenance and Physical Exam  Exercise Activities and Dietary recommendations  Goals      DIET - EAT MORE FRUITS AND VEGETABLES        Immunization History  Administered Date(s) Administered   Fluad Quad(high Dose 65+) 02/14/2020   Influenza, High Dose Seasonal PF 03/04/2017, 03/16/2018, 02/08/2019   Influenza, Seasonal, Injecte, Preservative Fre 03/01/2012, 03/18/2013, 03/10/2014   Influenza,inj,Quad PF,6+ Mos 03/23/2015, 04/09/2016   Influenza-Unspecified 03/02/2021   PFIZER(Purple Top)SARS-COV-2 Vaccination 06/01/2019, 06/25/2019, 01/20/2020   Pneumococcal Conjugate-13 09/07/2013   Pneumococcal Polysaccharide-23 07/22/2007   Tdap 05/25/1996, 10/21/2006, 08/06/2011   Zoster Recombinat (Shingrix) 10/09/2016, 12/20/2016   Zoster, Live 08/02/2007    Health Maintenance  Topic Date Due   COVID-19 Vaccine (4 - Pfizer risk series) 03/16/2020   TETANUS/TDAP  08/05/2021   INFLUENZA VACCINE  12/24/2021   Pneumonia Vaccine 37+ Years old  Completed   DEXA SCAN  Completed   Zoster Vaccines- Shingrix  Completed   HPV VACCINES  Aged Out    Discussed health benefits of physical activity, and encouraged her to engage in regular exercise appropriate for her age and condition.  1. Encounter for annual health examination   2. Osteoarthritis of spine with radiculopathy, lumbar region  - DG Bone Density  3. Urinary tract infection without hematuria, site unspecified  - nitrofurantoin,  macrocrystal-monohydrate, (MACROBID) 100 MG capsule; Take 1 capsule (100 mg total) by mouth 2 (two) times daily.  Dispense: 5 capsule; Refill: 2 4.  Cough/postnasal drainage Try Robitussin twice a day Work up As indicated  No follow-ups on file.     I, Wilhemena Durie, MD, have reviewed all documentation for this visit. The documentation on 01/19/22 for the exam, diagnosis, procedures, and orders are all accurate and complete.    Shaterra Sanzone Cranford Mon, MD  [facility] [phone] (phone) [phone] (fax)  [place]

## 2022-01-16 NOTE — Patient Instructions (Signed)
TRY ROBITUSSIN TWICE A DAY.

## 2022-01-30 ENCOUNTER — Ambulatory Visit (INDEPENDENT_AMBULATORY_CARE_PROVIDER_SITE_OTHER): Payer: Medicare Other | Admitting: Family Medicine

## 2022-01-30 DIAGNOSIS — Z23 Encounter for immunization: Secondary | ICD-10-CM | POA: Diagnosis not present

## 2022-02-03 NOTE — Progress Notes (Signed)
Flu shot only

## 2022-02-14 ENCOUNTER — Other Ambulatory Visit: Payer: Self-pay | Admitting: Family Medicine

## 2022-02-14 DIAGNOSIS — E785 Hyperlipidemia, unspecified: Secondary | ICD-10-CM

## 2022-02-14 NOTE — Telephone Encounter (Signed)
Copied from [place] [id number]. Topic: General - Other >> Feb 14, 2022 11:14 AM Everette C wrote: Reason for CRM: Medication Refill - Medication: pravastatin (PRAVACHOL) 40 MG tablet [[id number]]   Has the patient contacted their pharmacy? Yes.  The patient has been directed to contact their PCP  (Agent: If no, request that the patient contact the pharmacy for the refill. If patient does not wish to contact the pharmacy document the reason why and proceed with request.) (Agent: If yes, when and what did the pharmacy advise?)  Preferred Pharmacy (with phone number or street name): [place], [place] - [place]  [place] [address] Phone: [phone] Fax: [phone] Hours: Not open 24 hours   Has the patient been seen for an appointment in the last year OR does the patient have an upcoming appointment? Yes.    Agent: Please be advised that RX refills may take up to 3 business days. We ask that you follow-up with your pharmacy.

## 2022-02-14 NOTE — Telephone Encounter (Signed)
Duplicate request  Requested Prescriptions  Pending Prescriptions Disp Refills  . pravastatin (PRAVACHOL) 40 MG tablet 90 tablet 3    Sig: Take 1 tablet (40 mg total) by mouth daily.     Cardiovascular:  Antilipid - Statins Failed - 02/14/2022 11:48 AM      Failed - Lipid Panel in normal range within the last 12 months    Cholesterol, Total  Date Value Ref Range Status  01/08/2022 167 100 - 199 mg/dL Final   LDL Chol Calc (NIH)  Date Value Ref Range Status  01/08/2022 85 0 - 99 mg/dL Final   HDL  Date Value Ref Range Status  01/08/2022 69 >39 mg/dL Final   Triglycerides  Date Value Ref Range Status  01/08/2022 66 0 - 149 mg/dL Final         Passed - Patient is not pregnant      Passed - Valid encounter within last 12 months    Recent Outpatient Visits          2 weeks ago Need for immunization against influenza   [facility] Jerrol Banana., MD   4 weeks ago Annual physical exam   [facility] Jerrol Banana., MD   6 months ago Urinary tract infection without hematuria, site unspecified   [facility] Jerrol Banana., MD   7 months ago Essential hypertension   [facility] Jerrol Banana., MD   8 months ago [place], Jake Church, DO      Future Appointments            In 7 months Nehemiah Massed Monia Sabal, MD [place]

## 2022-02-14 NOTE — Telephone Encounter (Signed)
Requested Prescriptions  Pending Prescriptions Disp Refills  . pravastatin (PRAVACHOL) 40 MG tablet [Pharmacy Med Name: Pravastatin Sodium 40 MG Oral Tablet] 90 tablet 3    Sig: Take 1 tablet by mouth once daily     Cardiovascular:  Antilipid - Statins Failed - 02/14/2022 11:03 AM      Failed - Lipid Panel in normal range within the last 12 months    Cholesterol, Total  Date Value Ref Range Status  01/08/2022 167 100 - 199 mg/dL Final   LDL Chol Calc (NIH)  Date Value Ref Range Status  01/08/2022 85 0 - 99 mg/dL Final   HDL  Date Value Ref Range Status  01/08/2022 69 >39 mg/dL Final   Triglycerides  Date Value Ref Range Status  01/08/2022 66 0 - 149 mg/dL Final         Passed - Patient is not pregnant      Passed - Valid encounter within last 12 months    Recent Outpatient Visits          2 weeks ago Need for immunization against influenza   [facility] Jerrol Banana., MD   4 weeks ago Annual physical exam   [facility] Jerrol Banana., MD   6 months ago Urinary tract infection without hematuria, site unspecified   [facility] Jerrol Banana., MD   7 months ago Essential hypertension   [facility] Jerrol Banana., MD   8 months ago [place], Jake Church, DO      Future Appointments            In 7 months Ralene Bathe, MD [place]

## 2022-03-03 ENCOUNTER — Ambulatory Visit
Admission: RE | Admit: 2022-03-03 | Discharge: 2022-03-03 | Disposition: A | Discharge status: Home / Self Care | Payer: Medicare Other | Source: Ambulatory Visit | Attending: Family Medicine | Admitting: Family Medicine

## 2022-03-03 ENCOUNTER — Telehealth: Payer: Self-pay | Admitting: Cardiovascular Disease

## 2022-03-03 DIAGNOSIS — M85852 Other specified disorders of bone density and structure, left thigh: Secondary | ICD-10-CM | POA: Insufficient documentation

## 2022-03-03 DIAGNOSIS — I10 Essential (primary) hypertension: Secondary | ICD-10-CM

## 2022-03-03 DIAGNOSIS — M4726 Other spondylosis with radiculopathy, lumbar region: Secondary | ICD-10-CM | POA: Insufficient documentation

## 2022-03-03 DIAGNOSIS — Z13820 Encounter for screening for osteoporosis: Secondary | ICD-10-CM | POA: Insufficient documentation

## 2022-03-03 DIAGNOSIS — Z1231 Encounter for screening mammogram for malignant neoplasm of breast: Secondary | ICD-10-CM | POA: Insufficient documentation

## 2022-03-03 DIAGNOSIS — Z780 Asymptomatic menopausal state: Secondary | ICD-10-CM | POA: Insufficient documentation

## 2022-03-03 MED ORDER — DOXAZOSIN MESYLATE 1 MG PO TABS: 1.0000 mg | ORAL_TABLET | Freq: Two times a day (BID) | ORAL | 1 refills | Status: DC

## 2022-03-03 NOTE — Telephone Encounter (Signed)
Pt c/o medication issue:  1. Name of Medication:   doxazosin (CARDURA) 2 MG tablet    2. How are you currently taking this medication (dosage and times per day)? As prescribed  3. Are you having a reaction (difficulty breathing--STAT)?   4. What is your medication issue? Pt states that when she was in last Dr. Rockey Situ told her she could get this medication in 1 mg to take 2 x daily and was wondering if she could have 1 mg called in for her. Please advice.

## 2022-03-03 NOTE — Telephone Encounter (Signed)
Spoke w/ pt.  She reports that she is tired of cutting her pills in 1/2 and would like whole pills sent in. She prefers 90 day supply.  Doxazosin 1 mg, take 1 tab BID, #180 sent to [facility] on [address]. She is appreciative of the call.

## 2022-03-04 ENCOUNTER — Other Ambulatory Visit: Payer: Self-pay | Admitting: Family Medicine

## 2022-03-04 DIAGNOSIS — N6489 Other specified disorders of breast: Secondary | ICD-10-CM

## 2022-03-04 DIAGNOSIS — R928 Other abnormal and inconclusive findings on diagnostic imaging of breast: Secondary | ICD-10-CM

## 2022-03-25 ENCOUNTER — Ambulatory Visit
Admission: RE | Admit: 2022-03-25 | Discharge: 2022-03-25 | Disposition: A | Discharge status: Home / Self Care | Payer: Medicare Other | Source: Ambulatory Visit | Attending: Family Medicine | Admitting: Family Medicine

## 2022-03-25 ENCOUNTER — Other Ambulatory Visit: Payer: Self-pay | Admitting: Family Medicine

## 2022-03-25 DIAGNOSIS — N6489 Other specified disorders of breast: Secondary | ICD-10-CM | POA: Diagnosis not present

## 2022-03-25 DIAGNOSIS — R928 Other abnormal and inconclusive findings on diagnostic imaging of breast: Secondary | ICD-10-CM

## 2022-03-25 DIAGNOSIS — N63 Unspecified lump in unspecified breast: Secondary | ICD-10-CM

## 2022-03-25 DIAGNOSIS — N6321 Unspecified lump in the left breast, upper outer quadrant: Secondary | ICD-10-CM | POA: Diagnosis not present

## 2022-04-02 ENCOUNTER — Ambulatory Visit
Admission: RE | Admit: 2022-04-02 | Discharge: 2022-04-02 | Disposition: A | Discharge status: Home / Self Care | Payer: Medicare Other | Source: Ambulatory Visit | Attending: Family Medicine | Admitting: Family Medicine

## 2022-04-02 DIAGNOSIS — Z170 Estrogen receptor positive status [ER+]: Secondary | ICD-10-CM | POA: Diagnosis not present

## 2022-04-02 DIAGNOSIS — R928 Other abnormal and inconclusive findings on diagnostic imaging of breast: Secondary | ICD-10-CM | POA: Insufficient documentation

## 2022-04-02 DIAGNOSIS — N63 Unspecified lump in unspecified breast: Secondary | ICD-10-CM | POA: Insufficient documentation

## 2022-04-02 DIAGNOSIS — C50412 Malignant neoplasm of upper-outer quadrant of left female breast: Secondary | ICD-10-CM | POA: Diagnosis not present

## 2022-04-02 DIAGNOSIS — N6321 Unspecified lump in the left breast, upper outer quadrant: Secondary | ICD-10-CM | POA: Diagnosis not present

## 2022-04-02 HISTORY — PX: OTHER SURGICAL HISTORY: SHX169

## 2022-04-02 HISTORY — PX: BREAST BIOPSY: SHX20

## 2022-04-02 MED ORDER — LIDOCAINE-EPINEPHRINE 1 %-1:100000 IJ SOLN
10.0000 mL | Freq: Once | INTRAMUSCULAR | Status: AC
  Administered 2022-04-02: 10 mL via INTRADERMAL
  Filled 2022-04-02: qty 10

## 2022-04-02 MED ORDER — LIDOCAINE HCL (PF) 1 % IJ SOLN
1.0000 mL | Freq: Once | INTRAMUSCULAR | Status: AC
  Administered 2022-04-02: 1 mL via INTRADERMAL
  Filled 2022-04-02: qty 2

## 2022-04-03 ENCOUNTER — Encounter: Payer: Self-pay | Admitting: *Deleted

## 2022-04-03 LAB — SURGICAL PATHOLOGY

## 2022-04-03 IMAGING — DX DG KNEE 1-2V*L*
2 series · 2 of 2 positions shown · non-contrast
Comparison: None.

CLINICAL DATA: Pain following fall

EXAM:
LEFT KNEE - 1-2 VIEW

[knee ap]
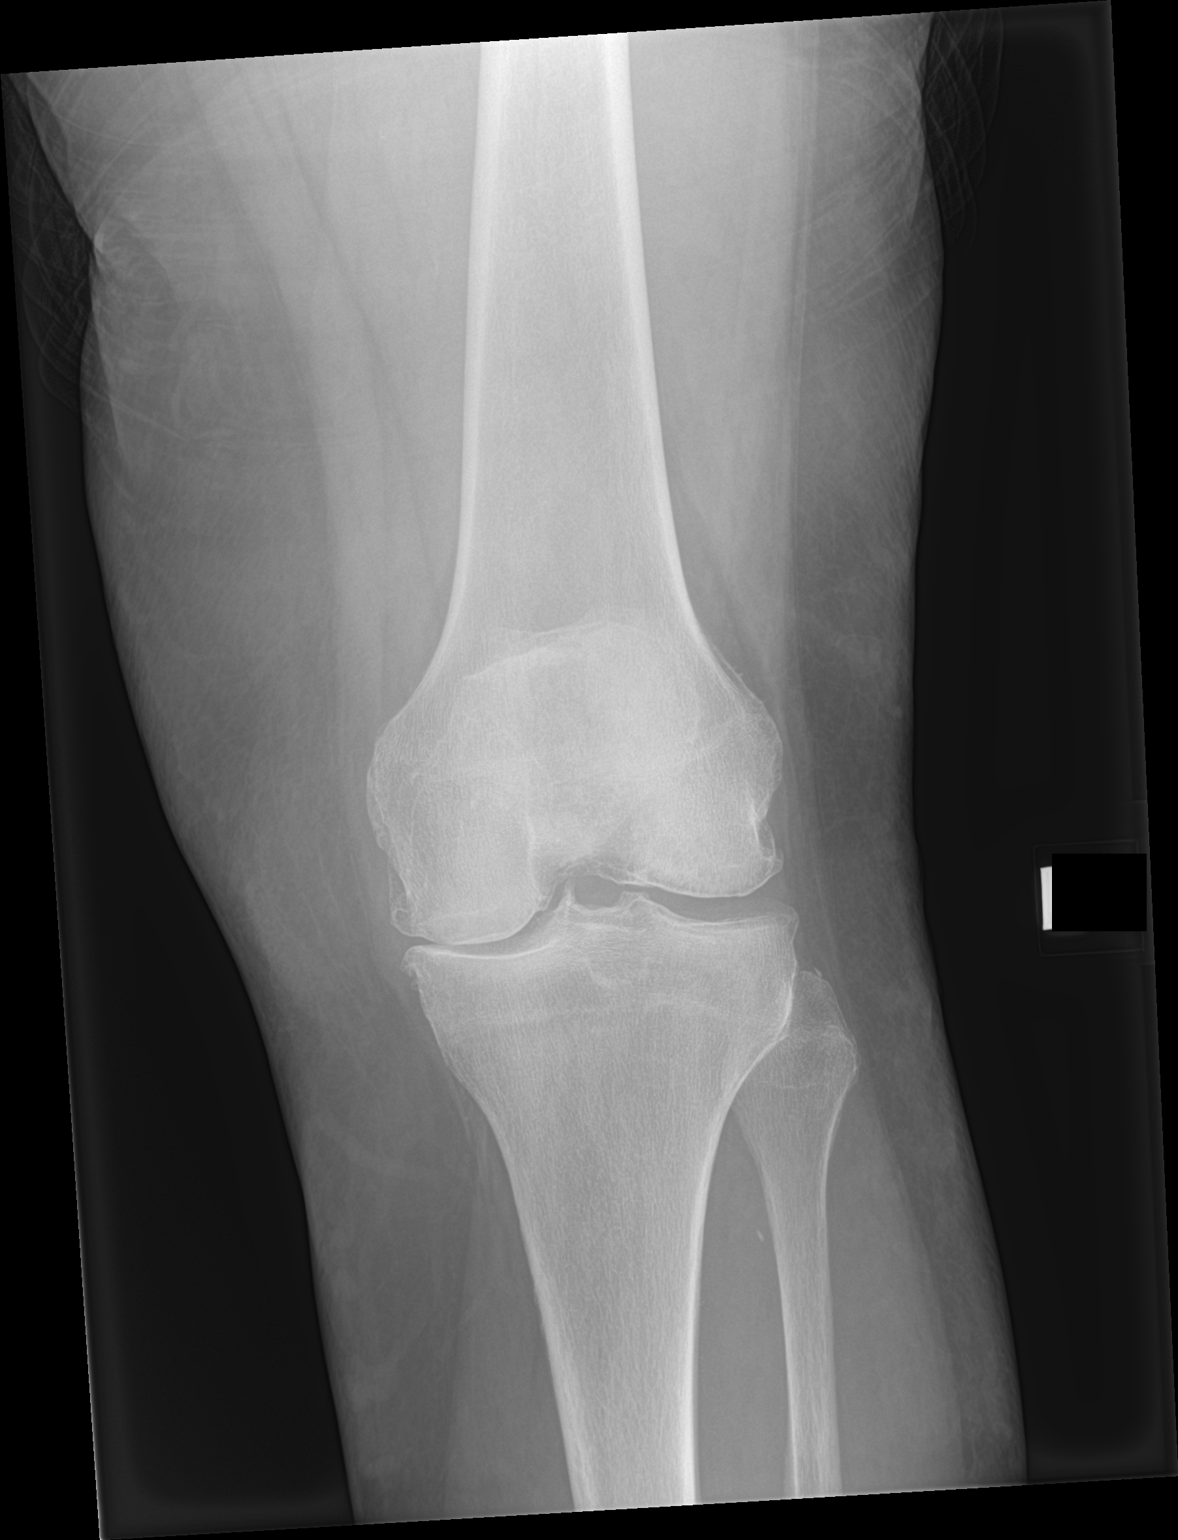

[knee lat]
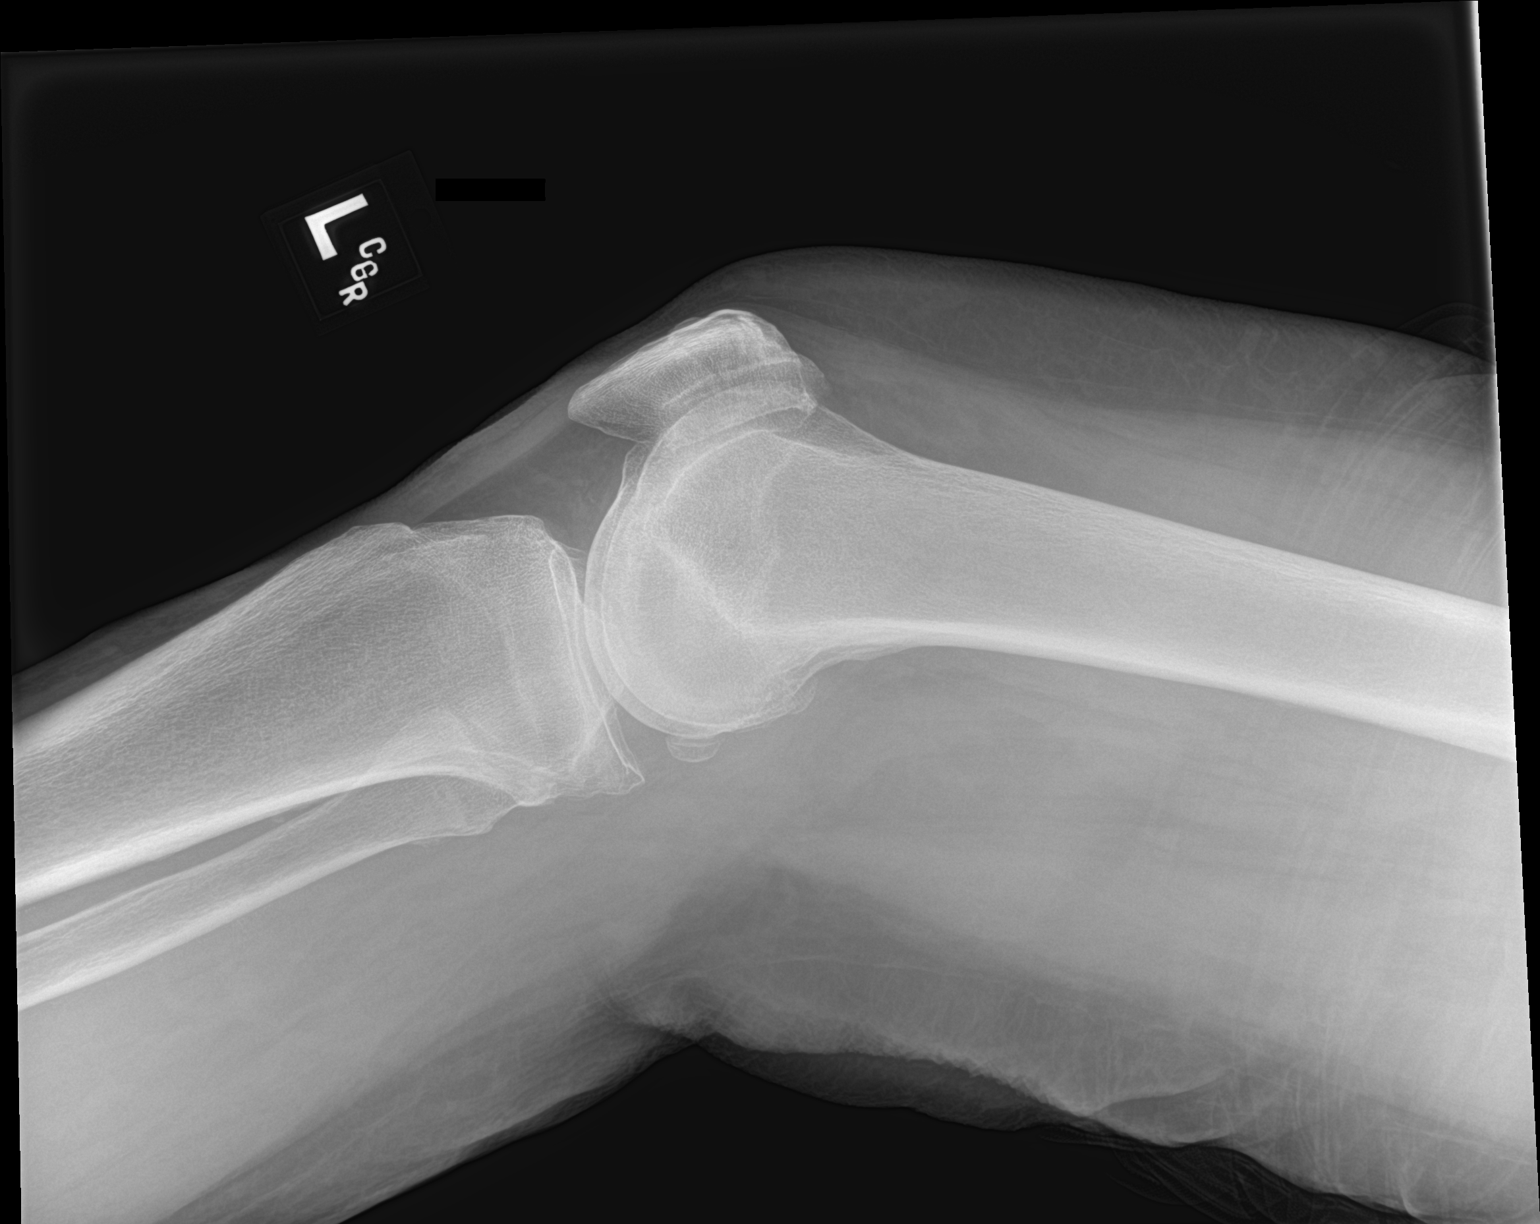

[2 of 2 positions shown; findings below may reference images not displayed]

FINDINGS: Frontal and lateral views were obtained. No fracture, dislocation,
or joint effusion. There is marked narrowing medially and in the
patellofemoral joint. There is spurring in all compartments. No
erosive change.
IMPRESSION: Severe joint space narrowing medially and in the patellofemoral
joint. No fracture, dislocation, or joint effusion.

## 2022-04-03 IMAGING — CT CT CERVICAL SPINE W/O CM
3 of 4 series · 12 of 35 positions shown, 14 images · non-contrast
Comparison: None.

CLINICAL DATA: Status post trip and fall today getting out of a
car. Initial encounter.

EXAM:
CT HEAD WITHOUT CONTRAST
CT CERVICAL SPINE WITHOUT CONTRAST
TECHNIQUE: Multidetector CT imaging of the head and cervical spine was
performed following the standard protocol without intravenous
contrast. Multiplanar CT image reconstructions of the cervical spine
were also generated.

[Series 5: sagittal bone · sagittal · 0.26mm/px · 5 of 81 slices shown, 6 images]
[im 27/81  bone]
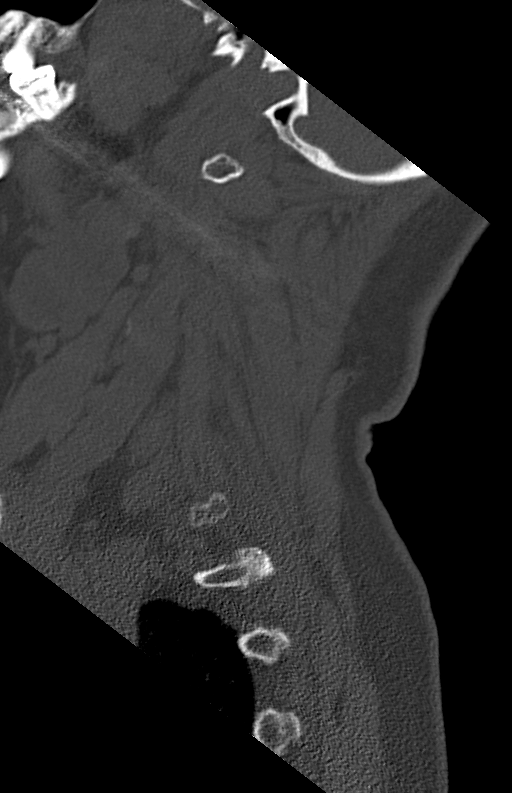
[im 34/81  bone]
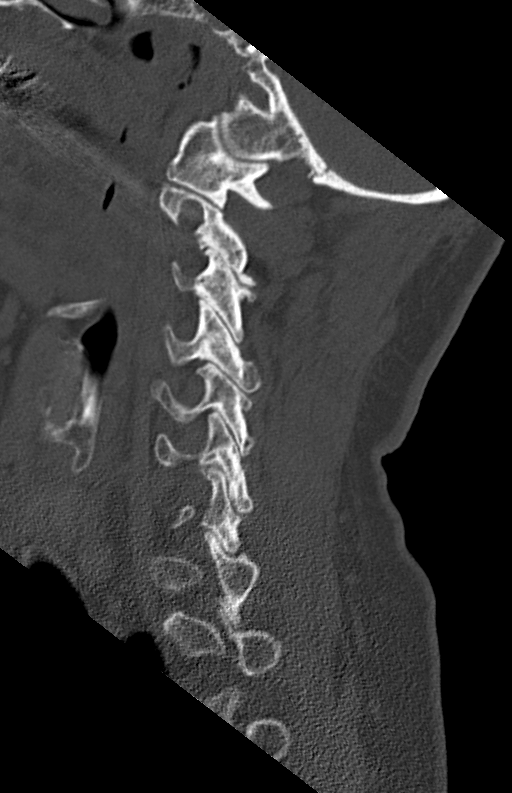
[im 41/81  soft-tissue]
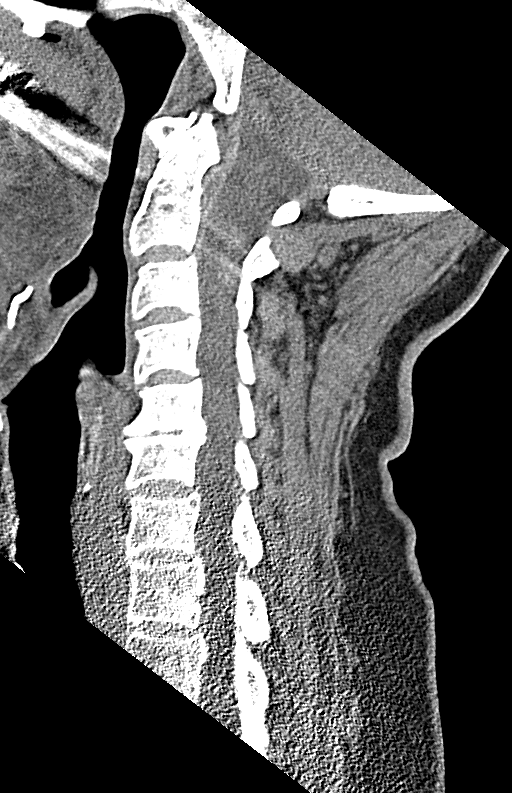
[im 41/81  bone]
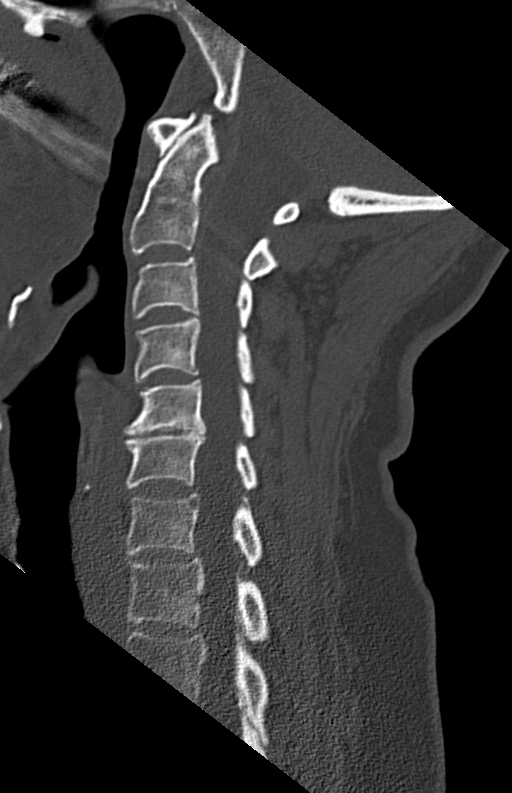
[im 47/81  bone]
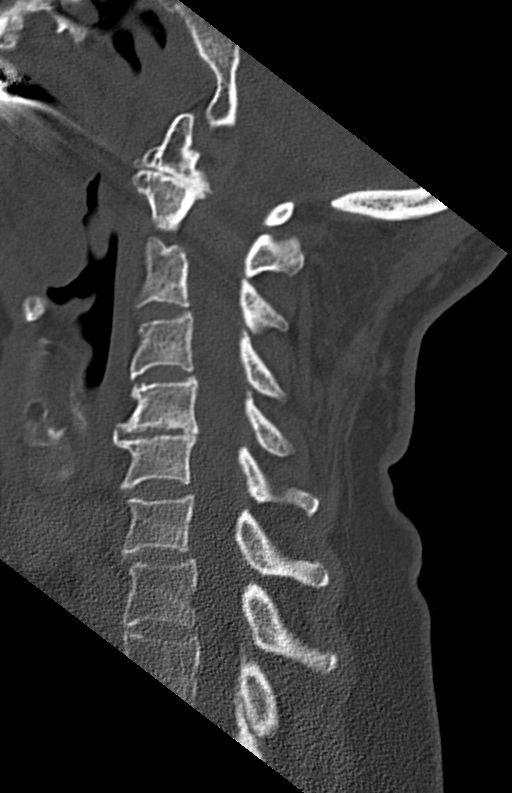
[im 54/81  bone]
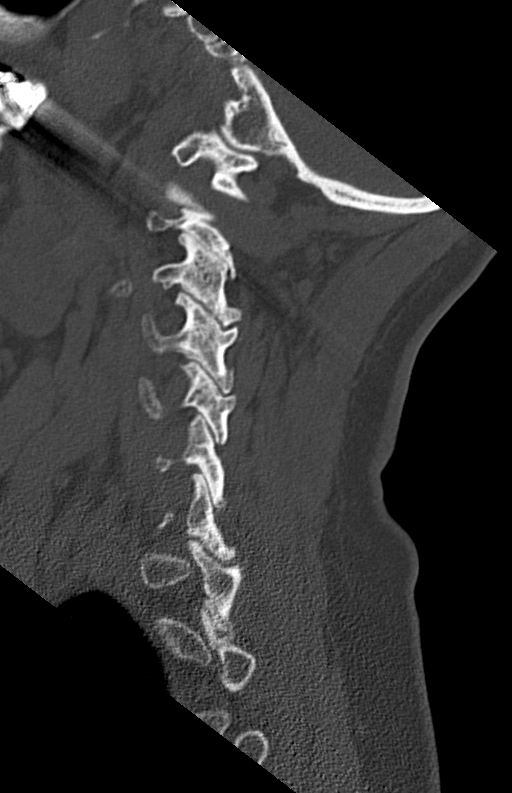

[Series 6: coronal bone · coronal · 0.26mm/px · 3 of 63 slices shown]
[im 18/63  bone]
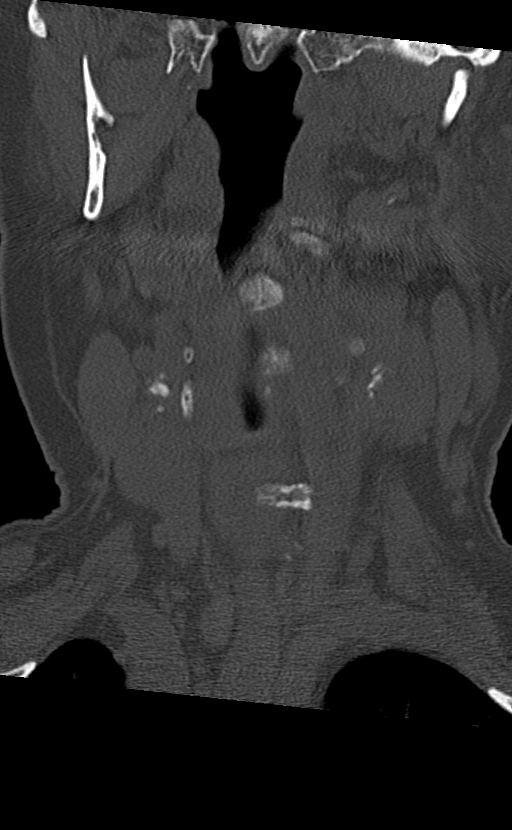
[im 27/63  bone]
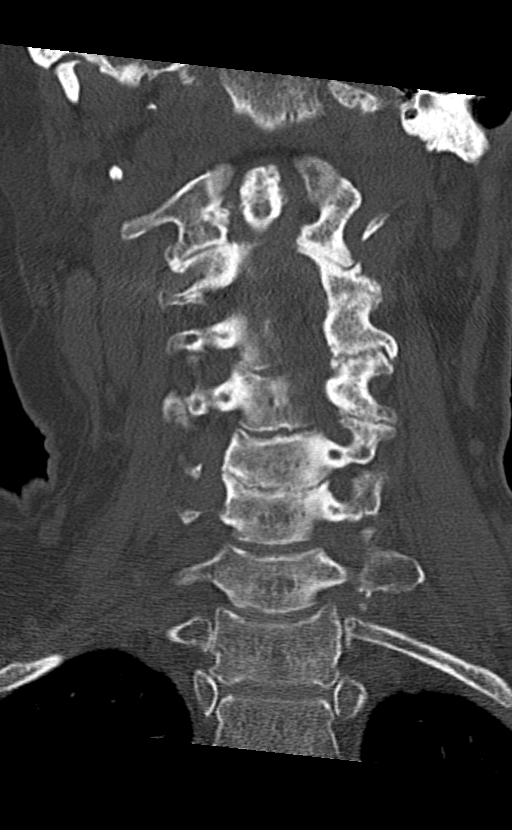
[im 36/63  bone]
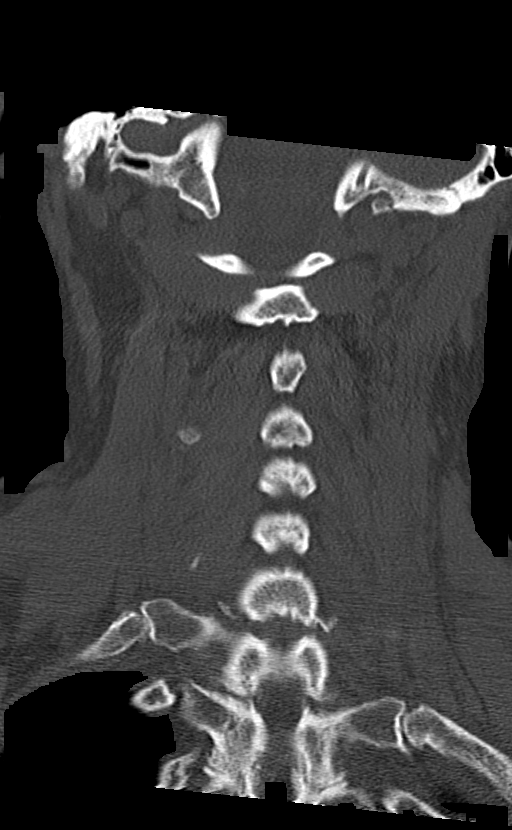

[Series 7: orthogonal axials · axial · 0.28mm/px · z∈[+197,+321]mm · 4 of 108 slices shown, 5 images]
[im 16/108  soft-tissue]
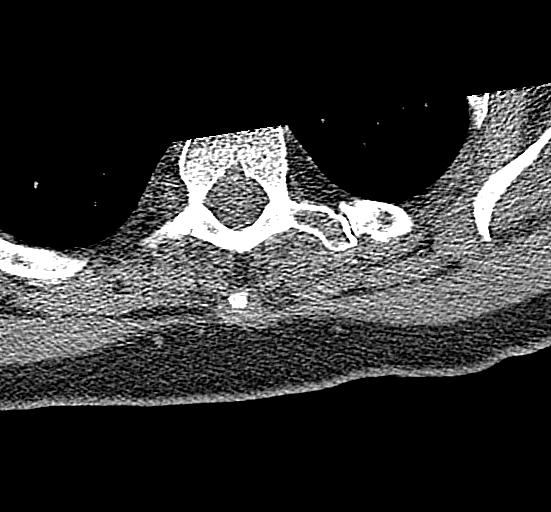
[im 16/108  bone]
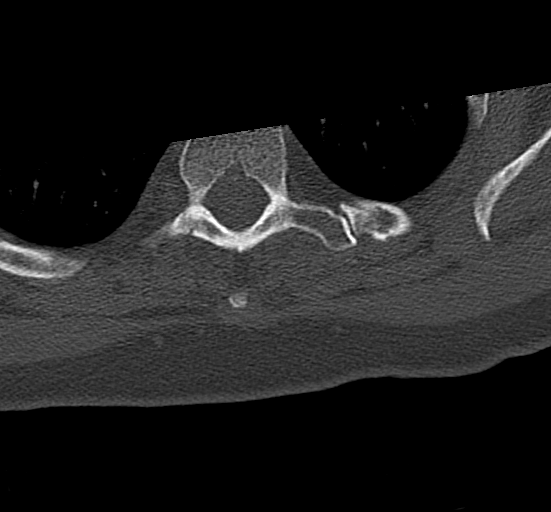
[im 46/108  bone]
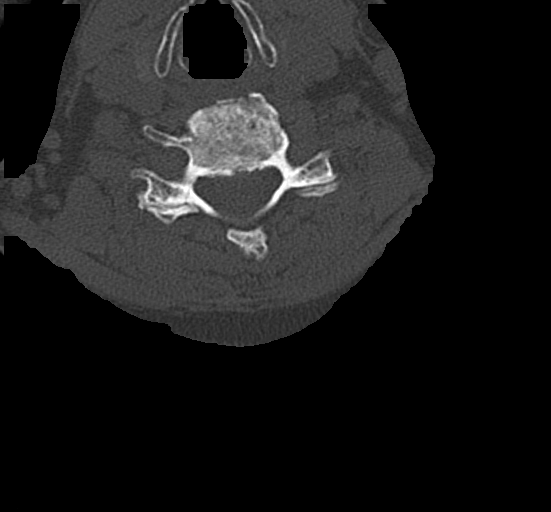
[im 62/108  bone]
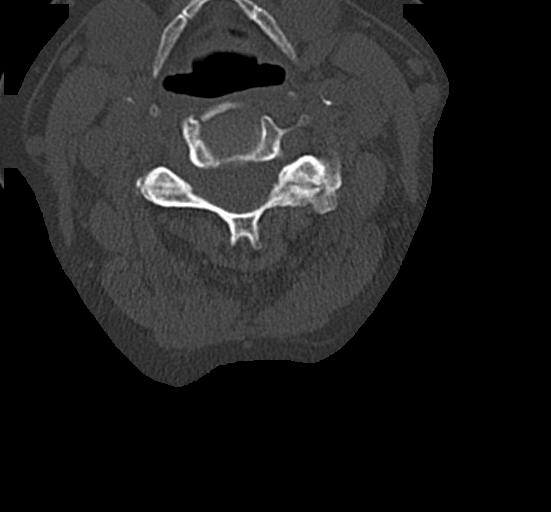
[im 92/108  bone]
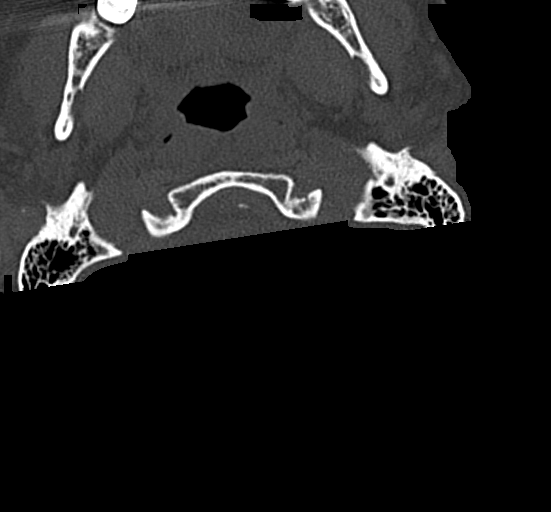

[12 of 35 positions shown; findings below may reference images not displayed]

FINDINGS: CT HEAD FINDINGS

Brain: No evidence of acute infarction, hemorrhage, hydrocephalus,
extra-axial collection or mass lesion/mass effect.

Vascular: No hyperdense vessel or unexpected calcification.

Skull: Intact.  No focal lesion.

Sinuses/Orbits: See report of dedicated maxillofacial CT scan today.

Other: Hematoma about the left eye noted.  Otherwise negative.

CT CERVICAL SPINE FINDINGS

Alignment: Maintained with straightening of lordosis noted.

Skull base and vertebrae: No acute fracture. No primary bone lesion
or focal pathologic process.

Soft tissues and spinal canal: No prevertebral fluid or swelling. No
visible canal hematoma.

Disc levels: Loss of disc space height and endplate spurring are
seen at C5-6. Multilevel facet degenerative disease appears worse on
the left at C3-4.

Upper chest: Lung apices clear.

Other: None.
IMPRESSION: Hematoma about the left eye. No other acute abnormality head or
cervical spine.

Atrophy and chronic microvascular ischemic change.

Degenerative disc disease most notable at C5-6.

## 2022-04-03 NOTE — Progress Notes (Signed)
Received referral for newly diagnosed breast cancer from [facility].  Navigation initiated.  Names of surgeons and medical oncologists given to the patient. She will call me back and let me know who she would like to see.

## 2022-04-04 ENCOUNTER — Encounter: Payer: Self-pay | Admitting: *Deleted

## 2022-04-04 ENCOUNTER — Telehealth: Payer: Self-pay | Admitting: Family Medicine

## 2022-04-04 DIAGNOSIS — C50912 Malignant neoplasm of unspecified site of left female breast: Secondary | ICD-10-CM

## 2022-04-04 NOTE — Progress Notes (Signed)
Patient called to cancel appt. With Dr. Lysle Pearl on Monday as they have gotten an appt. At [facility] scheduled.   The office has already closed but Dr. Lysle Pearl was notified she wanted to cancel.

## 2022-04-04 NOTE — Telephone Encounter (Signed)
The patient is somewhat anxious about this and wants to go ASAP to Dr. Josem Kaufmann, Surgical Oncologist at [facility].

## 2022-04-04 NOTE — Telephone Encounter (Signed)
Patient has found out she has breast CA and she is wanting to go to a Surgical Oncologist at [facility]. Phone # [phone]  Can you please refer her for an appt ASAP.    Please let patient know.

## 2022-04-04 NOTE — Progress Notes (Signed)
Spoke with Michelle Colla, Ms. Gangwer son.   They are scheduled to see Dr. Lysle Pearl on Monday 04/07/22 at 8:00am.  They would like to go to [facility] for medical oncology.  I told them Dr. Lysle Pearl could possibly refer them to [facility] when they see him Monday.

## 2022-04-04 NOTE — Telephone Encounter (Signed)
Referral placed.   Eulis Foster, MD  [facility]  [phone]

## 2022-04-04 NOTE — Addendum Note (Signed)
Addended by: Adolph Pollack on: 04/04/2022 04:27 PM   Modules accepted: Orders

## 2022-04-04 NOTE — Addendum Note (Signed)
Addended by: Doristine Devoid on: 04/04/2022 10:23 AM   Modules accepted: Orders

## 2022-04-04 NOTE — Telephone Encounter (Signed)
I think is the correct referral I have pended.

## 2022-04-04 NOTE — Telephone Encounter (Signed)
Can you placed this order for Dr. Quentin Cornwall please.

## 2022-04-09 DIAGNOSIS — C50412 Malignant neoplasm of upper-outer quadrant of left female breast: Secondary | ICD-10-CM | POA: Diagnosis not present

## 2022-04-10 ENCOUNTER — Other Ambulatory Visit: Payer: Self-pay | Admitting: Family Medicine

## 2022-04-10 DIAGNOSIS — I10 Essential (primary) hypertension: Secondary | ICD-10-CM

## 2022-04-10 MED ORDER — GABAPENTIN 100 MG PO CAPS: 100.0000 mg | ORAL_CAPSULE | Freq: Three times a day (TID) | ORAL | 1 refills | Status: AC

## 2022-04-10 MED ORDER — HYDROCHLOROTHIAZIDE 12.5 MG PO CAPS: 12.5000 mg | ORAL_CAPSULE | Freq: Every day | ORAL | 1 refills | Status: DC

## 2022-04-10 NOTE — Telephone Encounter (Signed)
[place] faxed refill request for the following medications:   hydrochlorothiazide (MICROZIDE) 12.5 MG capsule   gabapentin (NEURONTIN) 100 MG capsule   Please advise.

## 2022-04-14 DIAGNOSIS — Z170 Estrogen receptor positive status [ER+]: Secondary | ICD-10-CM | POA: Diagnosis not present

## 2022-04-14 DIAGNOSIS — R928 Other abnormal and inconclusive findings on diagnostic imaging of breast: Secondary | ICD-10-CM | POA: Diagnosis not present

## 2022-04-14 DIAGNOSIS — C50412 Malignant neoplasm of upper-outer quadrant of left female breast: Secondary | ICD-10-CM | POA: Diagnosis not present

## 2022-04-16 DIAGNOSIS — C50412 Malignant neoplasm of upper-outer quadrant of left female breast: Secondary | ICD-10-CM | POA: Diagnosis not present

## 2022-04-16 DIAGNOSIS — Z170 Estrogen receptor positive status [ER+]: Secondary | ICD-10-CM | POA: Diagnosis not present

## 2022-04-16 DIAGNOSIS — C50912 Malignant neoplasm of unspecified site of left female breast: Secondary | ICD-10-CM | POA: Diagnosis not present

## 2022-04-23 DIAGNOSIS — C50412 Malignant neoplasm of upper-outer quadrant of left female breast: Secondary | ICD-10-CM | POA: Diagnosis not present

## 2022-04-23 DIAGNOSIS — N6321 Unspecified lump in the left breast, upper outer quadrant: Secondary | ICD-10-CM | POA: Diagnosis not present

## 2022-04-23 DIAGNOSIS — Z170 Estrogen receptor positive status [ER+]: Secondary | ICD-10-CM | POA: Diagnosis not present

## 2022-04-30 DIAGNOSIS — Z0389 Encounter for observation for other suspected diseases and conditions ruled out: Secondary | ICD-10-CM | POA: Diagnosis not present

## 2022-04-30 DIAGNOSIS — Z170 Estrogen receptor positive status [ER+]: Secondary | ICD-10-CM | POA: Diagnosis not present

## 2022-04-30 DIAGNOSIS — C50412 Malignant neoplasm of upper-outer quadrant of left female breast: Secondary | ICD-10-CM | POA: Diagnosis not present

## 2022-05-12 DIAGNOSIS — Z170 Estrogen receptor positive status [ER+]: Secondary | ICD-10-CM | POA: Diagnosis not present

## 2022-05-12 DIAGNOSIS — C50912 Malignant neoplasm of unspecified site of left female breast: Secondary | ICD-10-CM | POA: Diagnosis not present

## 2022-05-28 DIAGNOSIS — C50912 Malignant neoplasm of unspecified site of left female breast: Secondary | ICD-10-CM | POA: Diagnosis not present

## 2022-05-28 DIAGNOSIS — Z170 Estrogen receptor positive status [ER+]: Secondary | ICD-10-CM | POA: Diagnosis not present

## 2022-05-28 DIAGNOSIS — C50412 Malignant neoplasm of upper-outer quadrant of left female breast: Secondary | ICD-10-CM | POA: Diagnosis not present

## 2022-06-17 DIAGNOSIS — Z170 Estrogen receptor positive status [ER+]: Secondary | ICD-10-CM | POA: Diagnosis not present

## 2022-06-17 DIAGNOSIS — C50412 Malignant neoplasm of upper-outer quadrant of left female breast: Secondary | ICD-10-CM | POA: Diagnosis not present

## 2022-06-28 ENCOUNTER — Other Ambulatory Visit: Payer: Self-pay | Admitting: Family Medicine

## 2022-06-30 NOTE — Telephone Encounter (Signed)
Requested Prescriptions  Pending Prescriptions Disp Refills   fluticasone (FLONASE) 50 MCG/ACT nasal spray [Pharmacy Med Name: Fluticasone Propionate 50 MCG/ACT Nasal Suspension] 16 g 0    Sig: Use 2 spray(s) in each nostril once daily     Ear, Nose, and Throat: Nasal Preparations - Corticosteroids Passed - 06/28/2022  8:39 AM      Passed - Valid encounter within last 12 months    Recent Outpatient Visits           5 months ago Need for immunization against influenza   [place] Eulas Post, MD   5 months ago Annual physical exam   [facility] Eulas Post, MD   10 months ago Urinary tract infection without hematuria, site unspecified   [place] Eulas Post, MD   11 months ago Essential hypertension   [place] Eulas Post, MD   1 year ago [place], [place], DO       Future Appointments             In 3 months Ralene Bathe, MD [place]

## 2022-07-08 DIAGNOSIS — Z961 Presence of intraocular lens: Secondary | ICD-10-CM | POA: Diagnosis not present

## 2022-07-08 DIAGNOSIS — H43813 Vitreous degeneration, bilateral: Secondary | ICD-10-CM | POA: Diagnosis not present

## 2022-07-08 DIAGNOSIS — Z0100 Encounter for examination of eyes and vision without abnormal findings: Secondary | ICD-10-CM | POA: Diagnosis not present

## 2022-07-08 DIAGNOSIS — H353132 Nonexudative age-related macular degeneration, bilateral, intermediate dry stage: Secondary | ICD-10-CM | POA: Diagnosis not present

## 2022-07-14 DIAGNOSIS — Z1329 Encounter for screening for other suspected endocrine disorder: Secondary | ICD-10-CM | POA: Diagnosis not present

## 2022-07-14 DIAGNOSIS — E785 Hyperlipidemia, unspecified: Secondary | ICD-10-CM | POA: Diagnosis not present

## 2022-07-14 DIAGNOSIS — I10 Essential (primary) hypertension: Secondary | ICD-10-CM | POA: Diagnosis not present

## 2022-07-15 ENCOUNTER — Telehealth: Payer: Self-pay

## 2022-07-15 DIAGNOSIS — I10 Essential (primary) hypertension: Secondary | ICD-10-CM | POA: Diagnosis not present

## 2022-07-15 DIAGNOSIS — E7800 Pure hypercholesterolemia, unspecified: Secondary | ICD-10-CM | POA: Diagnosis not present

## 2022-07-15 DIAGNOSIS — K219 Gastro-esophageal reflux disease without esophagitis: Secondary | ICD-10-CM | POA: Diagnosis not present

## 2022-07-15 DIAGNOSIS — R55 Syncope and collapse: Secondary | ICD-10-CM | POA: Diagnosis not present

## 2022-07-15 NOTE — Telephone Encounter (Signed)
Patient came into office during lunch request RF of Azelaic Acid.  Left message for patient to return my call to confirm RF of Skin Medicinals Cream. aw

## 2022-07-23 ENCOUNTER — Telehealth: Payer: Self-pay

## 2022-07-23 NOTE — Telephone Encounter (Signed)
Skin Medicinals rosacea RF sent in per patient's request.

## 2022-08-26 ENCOUNTER — Other Ambulatory Visit: Payer: Self-pay | Admitting: Cardiovascular Disease

## 2022-08-26 DIAGNOSIS — I10 Essential (primary) hypertension: Secondary | ICD-10-CM

## 2022-09-10 DIAGNOSIS — K219 Gastro-esophageal reflux disease without esophagitis: Secondary | ICD-10-CM | POA: Diagnosis not present

## 2022-09-10 DIAGNOSIS — I10 Essential (primary) hypertension: Secondary | ICD-10-CM | POA: Diagnosis not present

## 2022-09-10 DIAGNOSIS — E7800 Pure hypercholesterolemia, unspecified: Secondary | ICD-10-CM | POA: Diagnosis not present

## 2022-09-10 DIAGNOSIS — N281 Cyst of kidney, acquired: Secondary | ICD-10-CM | POA: Diagnosis not present

## 2022-09-16 ENCOUNTER — Other Ambulatory Visit: Payer: Self-pay | Admitting: Family Medicine

## 2022-09-16 NOTE — Telephone Encounter (Signed)
Patient not under office care Requested Prescriptions  Pending Prescriptions Disp Refills   fluticasone (FLONASE) 50 MCG/ACT nasal spray [Pharmacy Med Name: Fluticasone Propionate 50 MCG/ACT Nasal Suspension] 16 g 0    Sig: Use 2 spray(s) in each nostril once daily     Ear, Nose, and Throat: Nasal Preparations - Corticosteroids Passed - 09/16/2022  9:53 AM      Passed - Valid encounter within last 12 months    Recent Outpatient Visits           7 months ago Need for immunization against influenza   [facility] Bosie Clos, MD   8 months ago Annual physical exam   [facility] Bosie Clos, MD   1 year ago Urinary tract infection without hematuria, site unspecified   [facility] Bosie Clos, MD   1 year ago Essential hypertension   [facility] Bosie Clos, MD   1 year ago COVID-19   [facility] He[place], Darl Householder, DO       Future Appointments             In 3 weeks Deirdre Evener, MD [facility]

## 2022-10-08 ENCOUNTER — Ambulatory Visit: Payer: Medicare Other | Admitting: Dermatology

## 2022-10-08 ENCOUNTER — Encounter: Payer: Self-pay | Admitting: Dermatology

## 2022-10-08 VITALS — BP 170/89

## 2022-10-08 DIAGNOSIS — Z79899 Other long term (current) drug therapy: Secondary | ICD-10-CM

## 2022-10-08 DIAGNOSIS — R21 Rash and other nonspecific skin eruption: Secondary | ICD-10-CM

## 2022-10-08 DIAGNOSIS — L820 Inflamed seborrheic keratosis: Secondary | ICD-10-CM | POA: Diagnosis not present

## 2022-10-08 DIAGNOSIS — Z1283 Encounter for screening for malignant neoplasm of skin: Secondary | ICD-10-CM | POA: Diagnosis not present

## 2022-10-08 DIAGNOSIS — D1801 Hemangioma of skin and subcutaneous tissue: Secondary | ICD-10-CM

## 2022-10-08 DIAGNOSIS — L570 Actinic keratosis: Secondary | ICD-10-CM

## 2022-10-08 DIAGNOSIS — X32XXXA Exposure to sunlight, initial encounter: Secondary | ICD-10-CM

## 2022-10-08 DIAGNOSIS — L578 Other skin changes due to chronic exposure to nonionizing radiation: Secondary | ICD-10-CM

## 2022-10-08 DIAGNOSIS — W908XXA Exposure to other nonionizing radiation, initial encounter: Secondary | ICD-10-CM | POA: Diagnosis not present

## 2022-10-08 DIAGNOSIS — L814 Other melanin hyperpigmentation: Secondary | ICD-10-CM | POA: Diagnosis not present

## 2022-10-08 DIAGNOSIS — L821 Other seborrheic keratosis: Secondary | ICD-10-CM

## 2022-10-08 DIAGNOSIS — D229 Melanocytic nevi, unspecified: Secondary | ICD-10-CM

## 2022-10-08 DIAGNOSIS — I8393 Asymptomatic varicose veins of bilateral lower extremities: Secondary | ICD-10-CM

## 2022-10-08 DIAGNOSIS — Z85828 Personal history of other malignant neoplasm of skin: Secondary | ICD-10-CM

## 2022-10-08 MED ORDER — MOMETASONE FUROATE 0.1 % EX CREA: 1.0000 | TOPICAL_CREAM | Freq: Two times a day (BID) | CUTANEOUS | 0 refills | Status: AC

## 2022-10-08 NOTE — Patient Instructions (Addendum)
Cryotherapy Aftercare  Wash gently with soap and water everyday.   Apply Vaseline and Band-Aid daily until healed.     Due to recent changes in healthcare laws, you may see results of your pathology and/or laboratory studies on MyChart before the doctors have had a chance to review them. We understand that in some cases there may be results that are confusing or concerning to you. Please understand that not all results are received at the same time and often the doctors may need to interpret multiple results in order to provide you with the best plan of care or course of treatment. Therefore, we ask that you please give us 2 business days to thoroughly review all your results before contacting the office for clarification. Should we see a critical lab result, you will be contacted sooner.   If You Need Anything After Your Visit  If you have any questions or concerns for your doctor, please call our main line at [phone] and press option 4 to reach your doctor's medical assistant. If no one answers, please leave a voicemail as directed and we will return your call as soon as possible. Messages left after 4 pm will be answered the following business day.   You may also send us a message via MyChart. We typically respond to MyChart messages within 1-2 business days.  For prescription refills, please ask your pharmacy to contact our office. Our fax number is [phone].  If you have an urgent issue when the clinic is closed that cannot wait until the next business day, you can page your doctor at the number below.    Please note that while we do our best to be available for urgent issues outside of office hours, we are not available 24/7.   If you have an urgent issue and are unable to reach us, you may choose to seek medical care at your doctor's office, retail clinic, urgent care center, or emergency room.  If you have a medical emergency, please immediately call 911 or go to the  emergency department.  Pager Numbers  - Dr. Kowalski: [phone]  - Dr. Moye: [phone]  - Dr. Stewart: [phone]  In the event of inclement weather, please call our main line at [phone] for an update on the status of any delays or closures.  Dermatology Medication Tips: Please keep the boxes that topical medications come in in order to help keep track of the instructions about where and how to use these. Pharmacies typically print the medication instructions only on the boxes and not directly on the medication tubes.   If your medication is too expensive, please contact our office at [phone] option 4 or send us a message through MyChart.   We are unable to tell what your co-pay for medications will be in advance as this is different depending on your insurance coverage. However, we may be able to find a substitute medication at lower cost or fill out paperwork to get insurance to cover a needed medication.   If a prior authorization is required to get your medication covered by your insurance company, please allow us 1-2 business days to complete this process.  Drug prices often vary depending on where the prescription is filled and some pharmacies may offer cheaper prices.  The website www.goodrx.com contains coupons for medications through different pharmacies. The prices here do not account for what the cost may be with help from insurance (it may be cheaper with your insurance), but the website can   give you the price if you did not use any insurance.  - You can print the associated coupon and take it with your prescription to the pharmacy.  - You may also stop by our office during regular business hours and pick up a GoodRx coupon card.  - If you need your prescription sent electronically to a different pharmacy, notify our office through [place] MyChart or by phone at [phone] option 4.     Si Usted Necesita Algo Despus de Su Visita  Tambin puede  enviarnos un mensaje a travs de MyChart. Por lo general respondemos a los mensajes de MyChart en el transcurso de 1 a 2 das hbiles.  Para renovar recetas, por favor pida a su farmacia que se ponga en contacto con nuestra oficina. Nuestro nmero de fax es el [phone].  Si tiene un asunto urgente cuando la clnica est cerrada y que no puede esperar hasta el siguiente da hbil, puede llamar/localizar a su doctor(a) al nmero que aparece a continuacin.   Por favor, tenga en cuenta que aunque hacemos todo lo posible para estar disponibles para asuntos urgentes fuera del horario de oficina, no estamos disponibles las 24 horas del da, los 7 das de la semana.   Si tiene un problema urgente y no puede comunicarse con nosotros, puede optar por buscar atencin mdica  en el consultorio de su doctor(a), en una clnica privada, en un centro de atencin urgente o en una sala de emergencias.  Si tiene una emergencia mdica, por favor llame inmediatamente al 911 o vaya a la sala de emergencias.  Nmeros de bper  - Dr. Kowalski: [phone]  - Dra. Moye: [phone]  - Dra. Stewart: [phone]  En caso de inclemencias del tiempo, por favor llame a nuestra lnea principal al [phone] para una actualizacin sobre el estado de cualquier retraso o cierre.  Consejos para la medicacin en dermatologa: Por favor, guarde las cajas en las que vienen los medicamentos de uso tpico para ayudarle a seguir las instrucciones sobre dnde y cmo usarlos. Las farmacias generalmente imprimen las instrucciones del medicamento slo en las cajas y no directamente en los tubos del medicamento.   Si su medicamento es muy caro, por favor, pngase en contacto con nuestra oficina llamando al [phone] y presione la opcin 4 o envenos un mensaje a travs de MyChart.   No podemos decirle cul ser su copago por los medicamentos por adelantado ya que esto es diferente dependiendo de la cobertura de su seguro.  Sin embargo, es posible que podamos encontrar un medicamento sustituto a menor costo o llenar un formulario para que el seguro cubra el medicamento que se considera necesario.   Si se requiere una autorizacin previa para que su compaa de seguros cubra su medicamento, por favor permtanos de 1 a 2 das hbiles para completar este proceso.  Los precios de los medicamentos varan con frecuencia dependiendo del lugar de dnde se surte la receta y alguna farmacias pueden ofrecer precios ms baratos.  El sitio web www.goodrx.com tiene cupones para medicamentos de diferentes farmacias. Los precios aqu no tienen en cuenta lo que podra costar con la ayuda del seguro (puede ser ms barato con su seguro), pero el sitio web puede darle el precio si no utiliz ningn seguro.  - Puede imprimir el cupn correspondiente y llevarlo con su receta a la farmacia.  - Tambin puede pasar por nuestra oficina durante el horario de atencin regular y recoger una tarjeta de cupones de GoodRx.  -   Si necesita que su receta se enve electrnicamente a una farmacia diferente, informe a nuestra oficina a travs de MyChart de [place] o por telfono llamando al [phone] y presione la opcin 4.  

## 2022-10-08 NOTE — Progress Notes (Signed)
Follow-Up Visit   Subjective  Michelle Lutz is a 82 y.o. female who presents for the following: Skin Cancer Screening and Full Body Skin Exam, hx of BCC, Pruritus forehead, chin 22m, no treatment, check spot R forehead ~33m  The patient presents for Total-Body Skin Exam (TBSE) for skin cancer screening and mole check. The patient has spots, moles and lesions to be evaluated, some may be new or changing and the patient has concerns that these could be cancer.    The following portions of the chart were reviewed this encounter and updated as appropriate: medications, allergies, medical history  Review of Systems:  No other skin or systemic complaints except as noted in HPI or Assessment and Plan.  Objective  Well appearing patient in no apparent distress; mood and affect are within normal limits.  A full examination was performed including scalp, head, eyes, ears, nose, lips, neck, chest, axillae, abdomen, back, buttocks, bilateral upper extremities, bilateral lower extremities, hands, feet, fingers, toes, fingernails, and toenails. All findings within normal limits unless otherwise noted below.   Relevant physical exam findings are noted in the Assessment and Plan.  Nose x 1 Pink scaly macules  R upper back x 1, face x 16 (17) Stuck on waxy paps with erythema    Assessment & Plan   LENTIGINES, SEBORRHEIC KERATOSES, HEMANGIOMAS - Benign normal skin lesions - Benign-appearing - Call for any changes  MELANOCYTIC NEVI - Tan-brown and/or pink-flesh-colored symmetric macules and papules - Benign appearing on exam today - Observation - Call clinic for new or changing moles - Recommend daily use of broad spectrum spf 30+ sunscreen to sun-exposed areas.   ACTINIC DAMAGE - Chronic condition, secondary to cumulative UV/sun exposure - diffuse scaly erythematous macules with underlying dyspigmentation - Recommend daily broad spectrum sunscreen SPF 30+ to sun-exposed areas, reapply  every 2 hours as needed.  - Staying in the shade or wearing long sleeves, sun glasses (UVA+UVB protection) and wide brim hats (4-inch brim around the entire circumference of the hat) are also recommended for sun protection.  - Call for new or changing lesions.  SKIN CANCER SCREENING PERFORMED TODAY.  HISTORY OF BASAL CELL CARCINOMA OF THE SKIN - No evidence of recurrence today - Recommend regular full body skin exams - Recommend daily broad spectrum sunscreen SPF 30+ to sun-exposed areas, reapply every 2 hours as needed.  - Call if any new or changing lesions are noted between office visits  - L temple, R temple, forehead, nose  AK (actinic keratosis) Nose x 1  Destruction of lesion - Nose x 1 Complexity: simple   Destruction method: cryotherapy   Informed consent: discussed and consent obtained   Timeout:  patient name, date of birth, surgical site, and procedure verified Lesion destroyed using liquid nitrogen: Yes   Region frozen until ice ball extended beyond lesion: Yes   Outcome: patient tolerated procedure well with no complications   Post-procedure details: wound care instructions given    Inflamed seborrheic keratosis (17) R upper back x 1, face x 16  Symptomatic, irritating, patient would like treated.   Destruction of lesion - R upper back x 1, face x 16 Complexity: simple   Destruction method: cryotherapy   Informed consent: discussed and consent obtained   Timeout:  patient name, date of birth, surgical site, and procedure verified Lesion destroyed using liquid nitrogen: Yes   Region frozen until ice ball extended beyond lesion: Yes   Outcome: patient tolerated procedure well with no complications  Post-procedure details: wound care instructions given     Rash face Exam: Mild pinkness with scale  Differential diagnosis:  Contact Vs Atopic  Treatment Plan: Mometasone cr bid to aa face until clear, then d/c   Topical steroids (such as triamcinolone,  fluocinolone, fluocinonide, mometasone, clobetasol, halobetasol, betamethasone, hydrocortisone) can cause thinning and lightening of the skin if they are used for too long in the same area. Your physician has selected the right strength medicine for your problem and area affected on the body. Please use your medication only as directed by your physician to prevent side effects.    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 Larger vessels may be covered if symptomatic and we would refer to vascular surgeon if treatment desired.   Return in about 1 year (around 10/08/2023) for TBSE, Hx of BCC, Hx of AKs.  I, Ardis Rowan, RMA, am acting as scribe for Armida Sans, MD .   Documentation: I have reviewed the above documentation for accuracy and completeness, and I agree with the above.  Armida Sans, MD

## 2022-10-22 ENCOUNTER — Encounter: Payer: Self-pay | Admitting: Dermatology

## 2022-11-04 DIAGNOSIS — N6310 Unspecified lump in the right breast, unspecified quadrant: Secondary | ICD-10-CM | POA: Diagnosis not present

## 2022-11-04 DIAGNOSIS — R928 Other abnormal and inconclusive findings on diagnostic imaging of breast: Secondary | ICD-10-CM | POA: Diagnosis not present

## 2022-11-04 DIAGNOSIS — Z170 Estrogen receptor positive status [ER+]: Secondary | ICD-10-CM | POA: Diagnosis not present

## 2022-11-04 DIAGNOSIS — C50412 Malignant neoplasm of upper-outer quadrant of left female breast: Secondary | ICD-10-CM | POA: Diagnosis not present

## 2022-11-26 ENCOUNTER — Other Ambulatory Visit: Payer: Self-pay | Admitting: Cardiovascular Disease

## 2022-11-26 DIAGNOSIS — I10 Essential (primary) hypertension: Secondary | ICD-10-CM

## 2022-12-22 NOTE — Progress Notes (Unsigned)
Cardiology Office Note  Date:  12/23/2022   ID:  Michelle Lutz, [name] Oct 11, 1940, MRN [id number]  PCP:  Bosie Clos, MD   Chief Complaint  Patient presents with   12 month follow up     Patient c/o fluctuating blood pressure. Medications reviewed by the patient verbally.     HPI:  [name] is a 82 year old woman with past medical history of Hypertension Spinal stenosis Hyperlipidemia OSA on CPAP Cath 2003: no CAD Syncope, vasovagal (better off verapamil) F/u of her hypertension, near syncope /syncope  LOV 7/22 In follow-up today reports that she feels relatively well She has been tracking blood pressure consistently Typically takes blood pressure first thing in the morning before her morning medications Takes Cardura 1 mg twice daily, irbesartan 300 daily, will take HCTZ 25 if pressure is above 140 systolic Blood pressure rarely dropping low Occasional weak spells Most of the afternoon blood pressure is well-controlled 120 up to 130 systolic  Not going to gym No regular exercise program Denies chest pain or shortness of breath concerning for angina  No recent orthostasis symptoms, no near-syncope or syncope  EKG personally reviewed by myself on todays visit EKG Interpretation Date/Time:  Tuesday December 23 2022 11:56:15 EDT Ventricular Rate:  67 PR Interval:  190 QRS Duration:  84 QT Interval:  386 QTC Calculation: 407 R Axis:   9  Text Interpretation: Sinus rhythm with frequent Premature ventricular complexes Septal infarct (cited on or before 01-Mar-2019) When compared with ECG of 01-Mar-2019 08:57, Premature ventricular complexes are now Present Confirmed by Julien Nordmann [phone]) on 12/23/2022 12:19:03 PM    Lab Results  Component Value Date   CHOL 167 01/08/2022   HDL 69 01/08/2022   LDLCALC 85 01/08/2022   TRIG 66 01/08/2022    PMH:   has a past medical history of Actinic keratosis, Allergy, Arthritis, Basal cell carcinoma, Cataract, GERD  (gastroesophageal reflux disease), Hypertension, Osteoarthritis, Sleep apnea, and Spinal stenosis.  PSH:    Past Surgical History:  Procedure Laterality Date   breast biopsy Left 04/02/2022   u/s bx coil clip path pending   BREAST BIOPSY Left 04/02/2022   [place] LT BREAST BX W LOC DEV 1ST LESION IMG BX SPEC US GUIDE 04/02/2022 [facility]   BREAST CYST ASPIRATION     20+ years   BREAST EXCISIONAL BIOPSY Right    1980's   CESAREAN SECTION     EYE SURGERY     FOOT SURGERY     JOINT REPLACEMENT Right 07/07/2016   Partial R. knee in 2008 then complete in 2018   uterine prolapse repair     Tacking uterus    Current Outpatient Medications  Medication Sig Dispense Refill   Biotin 10 MG CAPS Take 10 mg by mouth daily.      Calcium Carb-Cholecalciferol (CALCIUM CARBONATE-VITAMIN D3) 600-400 MG-UNIT TABS Take 1 tablet by mouth 2 (two) times daily.     Calcium Polycarbophil (FIBER) 625 MG TABS Take by mouth.     carboxymethylcellulose (REFRESH PLUS) 0.5 % SOLN Place 1 drop into both eyes 4 (four) times daily as needed (dry eyes).      cetirizine (ZYRTEC) 10 MG tablet Take 10 mg by mouth daily.     Cholecalciferol 25 MCG (1000 UT) tablet Take 1,000 Units by mouth daily.      Cranberry 450 MG TABS Take 450 mg by mouth daily.     diphenoxylate-atropine (LOMOTIL) 2.5-0.025 MG tablet Take 1 tablet by mouth 4 (four)  times daily as needed for diarrhea or loose stools.     doxazosin (CARDURA) 1 MG tablet Take 1 tablet by mouth twice daily 60 tablet 0   fluticasone (FLONASE) 50 MCG/ACT nasal spray Use 2 spray(s) in each nostril once daily 16 g 0   gabapentin (NEURONTIN) 100 MG capsule Take 1 capsule (100 mg total) by mouth 3 (three) times daily. 270 capsule 1   hydrochlorothiazide (MICROZIDE) 12.5 MG capsule Take 1 capsule (12.5 mg total) by mouth daily. 90 capsule 1   HYDROcodone-acetaminophen (NORCO) 7.5-325 MG tablet Take 1 tablet by mouth every 6 (six) hours as needed for moderate pain or severe  pain.     ipratropium (ATROVENT) 0.06 % nasal spray Place 2 sprays into both nostrils 2 (two) times daily. 15 mL 5   irbesartan (AVAPRO) 300 MG tablet Take 1 tablet by mouth once daily 90 tablet 3   metroNIDAZOLE (METROCREAM) 0.75 % cream Apply 1 application topically 2 (two) times daily.     mometasone (ELOCON) 0.1 % cream Apply 1 Application topically in the morning and at bedtime. Bid to aa rash on face until clear, then prn flares 15 g 0   montelukast (SINGULAIR) 10 MG tablet Take 1 tablet (10 mg total) by mouth at bedtime. 90 tablet 3   Multiple Vitamin (MULTIVITAMIN) capsule Take 1 capsule by mouth daily.     Multiple Vitamins-Minerals (PRESERVISION AREDS 2 PO) Take by mouth.     omeprazole (PRILOSEC) 20 MG capsule Take 20 mg by mouth daily.     pravastatin (PRAVACHOL) 40 MG tablet Take 1 tablet by mouth once daily 90 tablet 3   Probiotic Product (ALIGN) 4 MG CAPS Take 4 mg by mouth daily.      sodium chloride (MURO 128) 5 % ophthalmic ointment Place 1 application into both eyes as needed for eye irritation.      zolpidem (AMBIEN) 5 MG tablet Take 5 mg by mouth at bedtime as needed for sleep.     No current facility-administered medications for this visit.     Allergies:   Aspirin, Benazepril, and Simvastatin   Social History:  The patient  reports that she has never smoked. She has never used smokeless tobacco.  No alcohol use . she reports current drug use.   Family History:   family history includes Arthritis in her brother and father; COPD in her father; Heart disease in her brother, father, and mother; Hypertension in her brother; Kidney disease in her mother; Macular degeneration in her mother.    Review of Systems: Review of Systems  Constitutional: Negative.   HENT: Negative.    Respiratory: Negative.    Cardiovascular: Negative.   Gastrointestinal: Negative.   Musculoskeletal: Negative.   Neurological:  Positive for dizziness.  Psychiatric/Behavioral: Negative.     All other systems reviewed and are negative.   PHYSICAL EXAM: VS:  BP (!) 150/90 (BP Location: Left Arm, Patient Position: Sitting, Cuff Size: Large)   Pulse 67   Ht 5\' 3"  (1.6 m)   Wt 174 lb 4 oz (79 kg)   SpO2 96%   BMI 30.87 kg/m  , BMI Body mass index is 30.87 kg/m. Constitutional:  oriented to person, place, and time. No distress.  HENT:  Head: Grossly normal Eyes:  no discharge. No scleral icterus.  Neck: No JVD, no carotid bruits  Cardiovascular: Regular rate and rhythm, no murmurs appreciated Pulmonary/Chest: Clear to auscultation bilaterally, no wheezes or rails Abdominal: Soft.  no distension.  no tenderness.  Musculoskeletal: Normal range of motion Neurological:  normal muscle tone. Coordination normal. No atrophy Skin: Skin warm and dry Psychiatric: normal affect, pleasant   Recent Labs: 01/08/2022: ALT 13; BUN 15; Creatinine, Ser 0.78; Hemoglobin 12.3; Platelets 212; Potassium 4.4; Sodium 138; TSH 4.100    Lipid Panel Lab Results  Component Value Date   CHOL 167 01/08/2022   HDL 69 01/08/2022   LDLCALC 85 01/08/2022   TRIG 66 01/08/2022      Wt Readings from Last 3 Encounters:  12/23/22 174 lb 4 oz (79 kg)  01/16/22 176 lb 14.4 oz (80.2 kg)  12/02/21 174 lb (78.9 kg)       ASSESSMENT AND PLAN:  Problem List Items Addressed This Visit       Cardiology Problems   Hyperlipidemia     Other   Sleep apnea   Other Visit Diagnoses     Syncope and collapse    -  Primary   Relevant Orders   EKG 12-Lead (Completed)   Essential hypertension       Relevant Orders   EKG 12-Lead (Completed)   OSA on CPAP          Syncope/near syncope Long history previous work-up with cardiology Will avoid aggressive management of blood pressure No recent symptoms Continue doxazosin to 1 mg twice daily  irbesartan 300 daily Only take HCTZ 25 daily as needed for blood pressure over 140 Recommend she hydrate in the morning and for any weak spells consistent  with low blood pressure  Essential hypertension No changes to medications above Measurements provided first thing in the morning before taking her blood pressure medications Blood pressure later in the day typically well-controlled 120 up to 130 systolic  Hyperlipidemia On pravastatin No ischemic work-up needed at this time  Obstructive sleep apnea On CPAP    Total encounter time more than 30 minutes  Greater than 50% was spent in counseling and coordination of care with the patient    Signed, Dossie Arbour, M.D., Ph.D. [facility] [place], [place] [phone]

## 2022-12-23 ENCOUNTER — Ambulatory Visit: Payer: Medicare Other | Attending: Cardiovascular Disease | Admitting: Cardiovascular Disease

## 2022-12-23 ENCOUNTER — Encounter: Payer: Self-pay | Admitting: Cardiovascular Disease

## 2022-12-23 VITALS — BP 150/90 | HR 67 | Ht 63.0 in | Wt 174.2 lb

## 2022-12-23 DIAGNOSIS — I10 Essential (primary) hypertension: Secondary | ICD-10-CM

## 2022-12-23 DIAGNOSIS — R55 Syncope and collapse: Secondary | ICD-10-CM

## 2022-12-23 DIAGNOSIS — E782 Mixed hyperlipidemia: Secondary | ICD-10-CM | POA: Diagnosis not present

## 2022-12-23 DIAGNOSIS — G4733 Obstructive sleep apnea (adult) (pediatric): Secondary | ICD-10-CM | POA: Diagnosis not present

## 2022-12-23 MED ORDER — DOXAZOSIN MESYLATE 1 MG PO TABS: 1.0000 mg | ORAL_TABLET | Freq: Two times a day (BID) | ORAL | 3 refills | Status: DC

## 2022-12-23 NOTE — Patient Instructions (Signed)

## 2023-01-13 DIAGNOSIS — H04123 Dry eye syndrome of bilateral lacrimal glands: Secondary | ICD-10-CM | POA: Diagnosis not present

## 2023-01-13 DIAGNOSIS — H43813 Vitreous degeneration, bilateral: Secondary | ICD-10-CM | POA: Diagnosis not present

## 2023-01-13 DIAGNOSIS — Z961 Presence of intraocular lens: Secondary | ICD-10-CM | POA: Diagnosis not present

## 2023-01-13 DIAGNOSIS — H353132 Nonexudative age-related macular degeneration, bilateral, intermediate dry stage: Secondary | ICD-10-CM | POA: Diagnosis not present

## 2023-01-22 DIAGNOSIS — H903 Sensorineural hearing loss, bilateral: Secondary | ICD-10-CM | POA: Diagnosis not present

## 2023-02-02 DIAGNOSIS — K219 Gastro-esophageal reflux disease without esophagitis: Secondary | ICD-10-CM | POA: Diagnosis not present

## 2023-02-02 DIAGNOSIS — Z1329 Encounter for screening for other suspected endocrine disorder: Secondary | ICD-10-CM | POA: Diagnosis not present

## 2023-02-02 DIAGNOSIS — I10 Essential (primary) hypertension: Secondary | ICD-10-CM | POA: Diagnosis not present

## 2023-02-02 DIAGNOSIS — E7800 Pure hypercholesterolemia, unspecified: Secondary | ICD-10-CM | POA: Diagnosis not present

## 2023-02-05 DIAGNOSIS — E7800 Pure hypercholesterolemia, unspecified: Secondary | ICD-10-CM | POA: Diagnosis not present

## 2023-02-05 DIAGNOSIS — I10 Essential (primary) hypertension: Secondary | ICD-10-CM | POA: Diagnosis not present

## 2023-02-24 DIAGNOSIS — M25561 Pain in right knee: Secondary | ICD-10-CM | POA: Diagnosis not present

## 2023-02-24 DIAGNOSIS — R202 Paresthesia of skin: Secondary | ICD-10-CM | POA: Diagnosis not present

## 2023-02-24 DIAGNOSIS — G629 Polyneuropathy, unspecified: Secondary | ICD-10-CM | POA: Diagnosis not present

## 2023-02-24 DIAGNOSIS — I10 Essential (primary) hypertension: Secondary | ICD-10-CM | POA: Diagnosis not present

## 2023-02-24 DIAGNOSIS — Z96651 Presence of right artificial knee joint: Secondary | ICD-10-CM | POA: Diagnosis not present

## 2023-04-04 ENCOUNTER — Other Ambulatory Visit: Payer: Self-pay | Admitting: Family Medicine

## 2023-04-06 NOTE — Telephone Encounter (Signed)
Requested Prescriptions  Pending Prescriptions Disp Refills   gabapentin (NEURONTIN) 100 MG capsule [Pharmacy Med Name: Gabapentin 100 MG Oral Capsule] 270 capsule 0    Sig: TAKE 1 CAPSULE BY MOUTH THREE TIMES DAILY     Neurology: Anticonvulsants - gabapentin Failed - 04/04/2023  7:30 AM      Failed - Cr in normal range and within 360 days    Creatinine, Ser  Date Value Ref Range Status  01/08/2022 0.78 0.57 - 1.00 mg/dL Final         Failed - Completed PHQ-2 or PHQ-9 in the last 360 days      Failed - Valid encounter within last 12 months    Recent Outpatient Visits           1 year ago Need for immunization against influenza   [facility] Bosie Clos, MD   1 year ago Annual physical exam   [facility] Bosie Clos, MD   1 year ago Urinary tract infection without hematuria, site unspecified   [facility] Bosie Clos, MD   1 year ago Essential hypertension   [facility] Bosie Clos, MD   1 year ago COVID-19   [facility] Caro Laroche, DO       Future Appointments             In 6 months Deirdre Evener, MD [facility]

## 2023-04-08 ENCOUNTER — Ambulatory Visit: Payer: Medicare Other | Admitting: Dermatology

## 2023-04-08 ENCOUNTER — Other Ambulatory Visit: Payer: Self-pay | Admitting: Family Medicine

## 2023-04-08 ENCOUNTER — Telehealth: Payer: Self-pay

## 2023-04-08 ENCOUNTER — Encounter: Payer: Self-pay | Admitting: Dermatology

## 2023-04-08 DIAGNOSIS — D492 Neoplasm of unspecified behavior of bone, soft tissue, and skin: Secondary | ICD-10-CM

## 2023-04-08 DIAGNOSIS — Z85828 Personal history of other malignant neoplasm of skin: Secondary | ICD-10-CM

## 2023-04-08 DIAGNOSIS — L720 Epidermal cyst: Secondary | ICD-10-CM | POA: Diagnosis not present

## 2023-04-08 DIAGNOSIS — C44629 Squamous cell carcinoma of skin of left upper limb, including shoulder: Secondary | ICD-10-CM

## 2023-04-08 DIAGNOSIS — L821 Other seborrheic keratosis: Secondary | ICD-10-CM

## 2023-04-08 DIAGNOSIS — C4492 Squamous cell carcinoma of skin, unspecified: Secondary | ICD-10-CM

## 2023-04-08 DIAGNOSIS — D489 Neoplasm of uncertain behavior, unspecified: Secondary | ICD-10-CM

## 2023-04-08 HISTORY — DX: Squamous cell carcinoma of skin, unspecified: C44.92

## 2023-04-08 NOTE — Progress Notes (Signed)
   Follow-Up Visit   Subjective  Michelle Lutz is a 82 y.o. female who presents for the following: patient here today concerning a spot she noticed 3 weeks ago at left hand . She states area is sore and will not go away. Reports a history of BCC  Also concerned spot at right upper lip that has been there for some time. Denies any symptoms    The patient has spots, moles and lesions to be evaluated, some may be new or changing and the patient may have concern these could be cancer.   The following portions of the chart were reviewed this encounter and updated as appropriate: medications, allergies, medical history  Review of Systems:  No other skin or systemic complaints except as noted in HPI or Assessment and Plan.  Objective  Well appearing patient in no apparent distress; mood and affect are within normal limits.   A focused examination was performed of the following areas: Left hand  Relevant exam findings are noted in the Assessment and Plan.  left medial dorsal hand 5 mm pink keratotic papule          Assessment & Plan   MILIA within SK Exam: tiny erythematous firm white papule right upper cutaneous lip  Discussed this is a type of cyst. Benign-appearing. Sometimes these will clear    Neoplasm of uncertain behavior left medial dorsal hand  Skin / nail biopsy Type of biopsy: tangential   Informed consent: discussed and consent obtained   Timeout: patient name, date of birth, surgical site, and procedure verified   Procedure prep:  Patient was prepped and draped in usual sterile fashion Prep type:  Isopropyl alcohol Anesthesia: the lesion was anesthetized in a standard fashion   Anesthetic:  1% lidocaine w/ epinephrine 1-100,000 buffered w/ 8.4% NaHCO3 Instrument used: DermaBlade   Hemostasis achieved with: pressure and aluminum chloride   Outcome: patient tolerated procedure well   Post-procedure details: sterile dressing applied and wound care  instructions given   Dressing type: bandage and petrolatum    Specimen 1 - Surgical pathology Differential Diagnosis: R/o BCC   Check Margins: No  R/o BCC   Milia     Return for keep follow up as scheduled .  I, Asher Muir, CMA, am acting as scribe for Elie Goody, MD.   Documentation: I have reviewed the above documentation for accuracy and completeness, and I agree with the above.  Elie Goody, MD

## 2023-04-08 NOTE — Patient Instructions (Addendum)
Biopsy Wound Care Instructions  Leave the original bandage on for 24 hours if possible.  If the bandage becomes soaked or soiled before that time, it is OK to remove it and examine the wound.  A small amount of post-operative bleeding is normal.  If excessive bleeding occurs, remove the bandage, place gauze over the site and apply continuous pressure (no peeking) over the area for 30 minutes. If this does not work, please call our clinic as soon as possible or page your doctor if it is after hours.   Once a day, cleanse the wound with soap and water. It is fine to shower. If a thick crust develops you may use a Q-tip dipped into dilute hydrogen peroxide (mix 1:1 with water) to dissolve it.  Hydrogen peroxide can slow the healing process, so use it only as needed.    After washing, apply petroleum jelly (Vaseline) or an antibiotic ointment if your doctor prescribed one for you, followed by a bandage.    For best healing, the wound should be covered with a layer of ointment at all times. If you are not able to keep the area covered with a bandage to hold the ointment in place, this may mean re-applying the ointment several times a day.  Continue this wound care until the wound has healed and is no longer open.   Itching and mild discomfort is normal during the healing process. However, if you develop pain or severe itching, please call our office.   If you have any discomfort, you can take Tylenol (acetaminophen) or ibuprofen as directed on the bottle. (Please do not take these if you have an allergy to them or cannot take them for another reason).  Some redness, tenderness and white or yellow material in the wound is normal healing.  If the area becomes very sore and red, or develops a thick yellow-green material (pus), it may be infected; please notify us.    If you have stitches, return to clinic as directed to have the stitches removed. You will continue wound care for 2-3 days after the stitches  are removed.   Wound healing continues for up to one year following surgery. It is not unusual to experience pain in the scar from time to time during the interval.  If the pain becomes severe or the scar thickens, you should notify the office.    A slight amount of redness in a scar is expected for the first six months.  After six months, the redness will fade and the scar will soften and fade.  The color difference becomes less noticeable with time.  If there are any problems, return for a post-op surgery check at your earliest convenience.  To improve the appearance of the scar, you can use silicone scar gel, cream, or sheets (such as Mederma or Serica) every night for up to one year. These are available over the counter (without a prescription).  Please call our office at [phone] for any questions or concerns.      Due to recent changes in healthcare laws, you may see results of your pathology and/or laboratory studies on MyChart before the doctors have had a chance to review them. We understand that in some cases there may be results that are confusing or concerning to you. Please understand that not all results are received at the same time and often the doctors may need to interpret multiple results in order to provide you with the best plan of care or  course of treatment. Therefore, we ask that you please give [place] 2 business days to thoroughly review all your results before contacting the office for clarification. Should we see a critical lab result, you will be contacted sooner.   If You Need Anything After Your Visit  If you have any questions or concerns for your doctor, please call our main line at [phone] and press option 4 to reach your doctor's medical assistant. If no one answers, please leave a voicemail as directed and we will return your call as soon as possible. Messages left after 4 pm will be answered the following business day.   You may also send [place] a message  via MyChart. We typically respond to MyChart messages within 1-2 business days.  For prescription refills, please ask your pharmacy to contact our office. Our fax number is [phone].  If you have an urgent issue when the clinic is closed that cannot wait until the next business day, you can page your doctor at the number below.    Please note that while we do our best to be available for urgent issues outside of office hours, we are not available 24/7.   If you have an urgent issue and are unable to reach [place], you may choose to seek medical care at your doctor's office, retail clinic, urgent care center, or emergency room.  If you have a medical emergency, please immediately call 911 or go to the emergency department.  Pager Numbers  - Dr. Gwen Pounds: [phone]  - Dr. Roseanne Reno: [phone]  - Dr. Katrinka Blazing: [phone]   In the event of inclement weather, please call our main line at [phone] for an update on the status of any delays or closures.  Dermatology Medication Tips: Please keep the boxes that topical medications come in in order to help keep track of the instructions about where and how to use these. Pharmacies typically print the medication instructions only on the boxes and not directly on the medication tubes.   If your medication is too expensive, please contact our office at [phone] option 4 or send [place] a message through MyChart.   We are unable to tell what your co-pay for medications will be in advance as this is different depending on your insurance coverage. However, we may be able to find a substitute medication at lower cost or fill out paperwork to get insurance to cover a needed medication.   If a prior authorization is required to get your medication covered by your insurance company, please allow [place] 1-2 business days to complete this process.  Drug prices often vary depending on where the prescription is filled and some pharmacies may offer cheaper  prices.  The website www.goodrx.com contains coupons for medications through different pharmacies. The prices here do not account for what the cost may be with help from insurance (it may be cheaper with your insurance), but the website can give you the price if you did not use any insurance.  - You can print the associated coupon and take it with your prescription to the pharmacy.  - You may also stop by our office during regular business hours and pick up a GoodRx coupon card.  - If you need your prescription sent electronically to a different pharmacy, notify our office through [facility] or by phone at [phone] option 4.     Si Usted Necesita Algo Despus de Su Visita  Tambin puede enviarnos un mensaje a travs de Clinical cytogeneticist. Por lo general respondemos  a los mensajes de MyChart en el transcurso de 1 a 2 das hbiles.  Para renovar recetas, por favor pida a su farmacia que se ponga en contacto con nuestra oficina. Annie Sable de fax es [place] [phone].  Si tiene un asunto urgente cuando la clnica est cerrada y que no puede esperar hasta el siguiente da hbil, puede llamar/localizar a su doctor(a) al nmero que aparece a continuacin.   Por favor, tenga en cuenta que aunque hacemos todo lo posible para estar disponibles para asuntos urgentes fuera del horario de [place], no estamos disponibles las 24 horas del da, los 7 [address]  [address] de la [place].   Si tiene un problema urgente y no puede comunicarse con nosotros, puede optar por buscar atencin mdica  en el consultorio de su doctor(a), en una clnica privada, en un centro de atencin urgente o en una sala de emergencias.  Si tiene Engineer, drilling, por favor llame inmediatamente al 911 o vaya a la sala de emergencias.  Nmeros de bper  - Dr. Gwen Pounds: [phone]  - Dra. Roseanne Reno: [phone]  - Dr. Katrinka Blazing: [phone]   En caso de inclemencias del tiempo, por favor llame a Lacy Duverney principal al [phone]  para una actualizacin sobre el [place] de cualquier retraso o cierre.  Consejos para la medicacin en dermatologa: Por favor, guarde las cajas en las que vienen los medicamentos de uso tpico para ayudarle a seguir las instrucciones sobre dnde y cmo usarlos. Las farmacias generalmente imprimen las instrucciones del medicamento slo en las cajas y no directamente en los tubos del [place].   Si su medicamento es muy caro, por favor, pngase en contacto con Rolm Gala llamando al [phone] y presione la opcin 4 o envenos un mensaje a travs de Clinical cytogeneticist.   No podemos decirle cul ser su copago por los medicamentos por adelantado ya que esto es diferente dependiendo de la cobertura de su seguro. Sin embargo, es posible que podamos encontrar un medicamento sustituto a Audiological scientist un formulario para que el seguro cubra el medicamento que se considera necesario.   Si se requiere una autorizacin previa para que su compaa de seguros [place] su medicamento, por favor permtanos de 1 a 2 das hbiles para completar [address]  Los precios de los medicamentos varan con frecuencia dependiendo del Environmental consultant de dnde se surte la receta y alguna farmacias pueden ofrecer precios ms baratos.  El sitio web www.goodrx.com tiene cupones para medicamentos de Health and safety inspector. Los precios aqu no tienen en cuenta lo que podra costar con la ayuda del seguro (puede ser ms barato con su seguro), pero el sitio web puede darle el precio si no utiliz Tourist information centre manager.  - Puede imprimir el cupn correspondiente y llevarlo con su receta a la farmacia.  - Tambin puede pasar por nuestra oficina durante el horario de atencin regular y Education officer, museum una tarjeta de cupones de GoodRx.  - Si necesita que su receta se enve electrnicamente a una farmacia diferente, informe a nuestra oficina a travs de MyChart de [place] o por telfono llamando al [phone] y presione la opcin 4.

## 2023-04-08 NOTE — Telephone Encounter (Signed)
Patient came into office and [facility] rosacea cream sent in. aw

## 2023-04-09 NOTE — Telephone Encounter (Signed)
Pt no longer under the care of Dr. Beryle Flock- please send to Dr Sullivan Lone at [place].  Requested Prescriptions  Refused Prescriptions Disp Refills   gabapentin (NEURONTIN) 100 MG capsule [Pharmacy Med Name: Gabapentin 100 MG Oral Capsule] 270 capsule 0    Sig: TAKE 1 CAPSULE BY MOUTH THREE TIMES DAILY     Neurology: Anticonvulsants - gabapentin Failed - 04/08/2023  9:10 AM      Failed - Cr in normal range and within 360 days    Creatinine, Ser  Date Value Ref Range Status  01/08/2022 0.78 0.57 - 1.00 mg/dL Final         Failed - Completed PHQ-2 or PHQ-9 in the last 360 days      Failed - Valid encounter within last 12 months    Recent Outpatient Visits           1 year ago Need for immunization against influenza   [facility] Bosie Clos, MD   1 year ago Annual physical exam   [facility] Bosie Clos, MD   1 year ago Urinary tract infection without hematuria, site unspecified   [facility] Bosie Clos, MD   1 year ago Essential hypertension   [facility] Bosie Clos, MD   1 year ago COVID-19   [facility] Caro Laroche, DO       Future Appointments             In 6 months Deirdre Evener, MD [facility]

## 2023-04-10 DIAGNOSIS — C50912 Malignant neoplasm of unspecified site of left female breast: Secondary | ICD-10-CM | POA: Diagnosis not present

## 2023-04-10 DIAGNOSIS — R928 Other abnormal and inconclusive findings on diagnostic imaging of breast: Secondary | ICD-10-CM | POA: Diagnosis not present

## 2023-04-10 DIAGNOSIS — C50412 Malignant neoplasm of upper-outer quadrant of left female breast: Secondary | ICD-10-CM | POA: Diagnosis not present

## 2023-04-10 DIAGNOSIS — Z170 Estrogen receptor positive status [ER+]: Secondary | ICD-10-CM | POA: Diagnosis not present

## 2023-04-10 LAB — SURGICAL PATHOLOGY

## 2023-04-13 ENCOUNTER — Telehealth: Payer: Self-pay

## 2023-04-13 NOTE — Telephone Encounter (Signed)
-----   Message from [place] sent at 04/11/2023  3:27 PM EST ----- Diagnosis: left medial dorsal hand :       WELL DIFFERENTIATED SQUAMOUS CELL CARCINOMA WITH SUPERFICIAL INFILTRATION, SEE       DESCRIPTION   Please call with diagnosis and determine where the patient would like to have Mohs surgery.  Explanation: This is a squamous cell skin cancer that has grown beyond the surface of the skin and is invading the second layer of the skin. It has the potential to spread beyond the skin and threaten your health, so I recommend treating it.  Treatment: Given the location and type of skin cancer, I recommend Mohs surgery. Mohs surgery involves cutting out the skin cancer and then checking under the microscope to ensure the whole skin cancer was removed. If any skin cancer remains, the surgeon will cut out more until it is fully removed. The cure rate is about 98-99%. Once the Mohs surgeon confirms the skin cancer is out, they will discuss the options to repair or heal the area. You must take it easy for about two weeks after surgery (no lifting over 10-15 lbs, avoid activity to get your heart rate and blood pressure up). It is done at another office outside of [place] ([place], [place], or [place]).  If the patient asks for my recommendation for Mohs surgeon, I recommend Dr Caprice Beaver or Dr Coralie Carpen at [facility] if the patient is able to make the trip.

## 2023-04-22 ENCOUNTER — Telehealth: Payer: Self-pay

## 2023-04-22 DIAGNOSIS — C44629 Squamous cell carcinoma of skin of left upper limb, including shoulder: Secondary | ICD-10-CM

## 2023-04-22 NOTE — Telephone Encounter (Signed)
-----  Message from [place] sent at 04/11/2023  3:27 PM EST ----- Diagnosis: left medial dorsal hand :       WELL DIFFERENTIATED SQUAMOUS CELL CARCINOMA WITH SUPERFICIAL INFILTRATION, SEE       DESCRIPTION   Please call with diagnosis and determine where the patient would like to have Mohs surgery.  Explanation: This is a squamous cell skin cancer that has grown beyond the surface of the skin and is invading the second layer of the skin. It has the potential to spread beyond the skin and threaten your health, so I recommend treating it.  Treatment: Given the location and type of skin cancer, I recommend Mohs surgery. Mohs surgery involves cutting out the skin cancer and then checking under the microscope to ensure the whole skin cancer was removed. If any skin cancer remains, the surgeon will cut out more until it is fully removed. The cure rate is about 98-99%. Once the Mohs surgeon confirms the skin cancer is out, they will discuss the options to repair or heal the area. You must take it easy for about two weeks after surgery (no lifting over 10-15 lbs, avoid activity to get your heart rate and blood pressure up). It is done at another office outside of [place] ([place], [place], or [place]).  If the patient asks for my recommendation for Mohs surgeon, I recommend Dr Caprice Beaver or Dr Coralie Carpen at [facility] if the patient is able to make the trip.

## 2023-04-22 NOTE — Telephone Encounter (Signed)
Patient called stating that she seen her pathology results on MyChart, and would like to discuss the pathology and treatment options. I discussed pathology with patient, and she said that she had Mohs in the past, but it was in [place]. Of the local Mohs surgeons pt prefers [place], [place], because it is closer to her.

## 2023-06-08 ENCOUNTER — Encounter: Payer: Self-pay | Admitting: Dermatology

## 2023-06-09 ENCOUNTER — Encounter: Payer: Self-pay | Admitting: Dermatology

## 2023-06-09 ENCOUNTER — Ambulatory Visit: Payer: Medicare Other | Admitting: Dermatology

## 2023-06-09 VITALS — BP 136/78 | HR 79 | Temp 98.7°F

## 2023-06-09 DIAGNOSIS — C44629 Squamous cell carcinoma of skin of left upper limb, including shoulder: Secondary | ICD-10-CM | POA: Diagnosis not present

## 2023-06-09 DIAGNOSIS — L814 Other melanin hyperpigmentation: Secondary | ICD-10-CM

## 2023-06-09 DIAGNOSIS — L579 Skin changes due to chronic exposure to nonionizing radiation, unspecified: Secondary | ICD-10-CM

## 2023-06-09 DIAGNOSIS — C4492 Squamous cell carcinoma of skin, unspecified: Secondary | ICD-10-CM

## 2023-06-09 MED ORDER — MUPIROCIN 2 % EX OINT: 1.0000 | TOPICAL_OINTMENT | Freq: Two times a day (BID) | CUTANEOUS | 0 refills | Status: AC

## 2023-06-09 MED ORDER — OXYCODONE HCL 5 MG PO CAPS: 5.0000 mg | ORAL_CAPSULE | Freq: Four times a day (QID) | ORAL | 0 refills | Status: AC | PRN

## 2023-06-09 NOTE — Patient Instructions (Signed)

## 2023-06-09 NOTE — Progress Notes (Signed)
 Follow-Up Visit   Subjective  Michelle Lutz is a 83 y.o. 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 Claudene. She is accompanied by her husband.   The following portions of the chart were reviewed this encounter and updated as appropriate: medications, allergies, medical history  Review of Systems:  No other skin or systemic complaints except as noted in HPI or Assessment and Plan.  Objective  Well appearing patient in no apparent distress; mood and affect are within normal limits.  A focused examination was performed of the following areas: Left medial dorsal hand Relevant physical exam findings are noted in the Assessment and Plan.     Assessment & Plan   SQUAMOUS CELL CARCINOMA OF SKIN Left medial Dorsal Hand Mohs surgery  Consent obtained: written  Anticoagulation: Is the patient taking prescription anticoagulant and/or aspirin prescribed/recommended by a physician? No   Was the anticoagulation regimen changed prior to Mohs? No    Anesthesia: Anesthesia method: local infiltration Local anesthetic: lidocaine  1% WITH epi  Procedure Details: Timeout: pre-procedure verification complete Procedure Prep: patient was prepped and draped in usual sterile fashion Prep type: chlorhexidine Biopsy lab: [facility] Frozen section biopsy performed: No   Specimen debulked: No   Pre-Op diagnosis: squamous cell carcinoma SCC subtype: well differentiated MohsAIQ Surgical site (if tumor spans multiple areas, please select predominant area): hand Surgery side: left Surgical site (from skin exam): Left medial Dorsal Hand Pre-operative length (cm): 1.8 Pre-operative width (cm): 1.2 Indications for Mohs surgery: anatomic location where tissue conservation is critical Previously treated? No    Micrographic Surgery Details: Post-operative length (cm): 2.1 Post-operative width (cm):  1.2 Number of Mohs stages: 1 Is this a complex case (associate members only): No    Stage 1    Tumor features identified on Mohs section: no tumor identified    Depth of defect after stage: subcutaneous fat  Patient tolerance of procedure: tolerated well, no immediate complications  Reconstruction: Was the defect reconstructed? Yes   Was reconstruction performed by the same Mohs surgeon? Yes   Setting of reconstruction: outpatient office When was reconstruction performed? same day Type of reconstruction: linear Linear reconstruction: complex Length of linear repair (cm): 4  Opioids: Did the patient receive a prescription for opioid/narcotic related to Mohs surgery? Yes   Indications for opioid/narcotics: patient required additional pain relief despite trial of non-opioid analgesia  Antibiotics: Does patient meet AHA guidelines for endocarditis?: No   Does patient meet AHA guidelines for orthopedic prophylaxis?: No   Were antibiotics given on the day of surgery?: No   When were antibiotics given? post-operative Did surgery breach mucosa, expose cartilage/bone, involve an area of lymphedema/inflamed/infected tissue? No   Indication for post-operative antibiotics: anatomic location  Skin repair Complexity:  Complex Final length (cm):  4 Informed consent: discussed and consent obtained   Timeout: patient name, date of birth, surgical site, and procedure verified   Procedure prep:  Patient was prepped and draped in usual sterile fashion Prep type:  Chlorhexidine Anesthesia: the lesion was anesthetized in a standard fashion   Anesthetic:  1% lidocaine  w/ epinephrine  1-100,000 buffered w/ 8.4% NaHCO3 Reason for type of repair: reduce tension to allow closure, allow closure of the large defect, preserve normal anatomy and allow side-to-side closure without requiring a flap or graft   Undermining: area extensively undermined   Subcutaneous layers (deep stitches):  Suture size:   4-0  Suture type: Vicryl (polyglactin 910)   Stitches:  Buried vertical mattress Fine/surface layer approximation (top stitches):  Suture size:  5-0 Suture type: fast-absorbing plain gut   Stitches: simple running   Hemostasis achieved with: suture, pressure and electrodesiccation Outcome: patient tolerated procedure well with no complications   Post-procedure details: sterile dressing applied and wound care instructions given   Dressing type: bacitracin and pressure dressing   Related Medications oxycodone  (OXY-IR) 5 MG capsule Take 1 capsule (5 mg total) by mouth every 6 (six) hours as needed for up to 8 doses. mupirocin  ointment (BACTROBAN ) 2 % Apply 1 Application topically 2 (two) times daily.  Return in about 4 weeks (around 07/07/2023).   Documentation: I have reviewed the above documentation for accuracy and completeness, and I agree with the above.   06/09/2023  HISTORY OF PRESENT ILLNESS  Michelle Lutz is seen in consultation at the request of Dr. Claudene for biopsy-proven Well Differentiated Squamous Cell Carcinoma on the left medial dorsal hand. They note that the area has been present for about 1 month increasing in size with time.  There is no history of previous treatment.  Reports no other new or changing lesions and has no other complaints today. She is accompanied by her husband.  Medications and allergies: see patient chart.  Review of systems: Reviewed 8 systems and notable for the above skin cancer.  All other systems reviewed are unremarkable/negative, unless noted in the HPI. Past medical history, surgical history, family history, social history were also reviewed and are noted in the chart/questionnaire.    PHYSICAL EXAMINATION  General: Well-appearing, in no acute distress, alert and oriented x 4. Vitals reviewed in chart (if available).   Skin: Exam reveals a 1.8 x 1.2 cm erythematous papule and biopsy scar on the left medial dorsal hand. There are rhytids,  telangiectasias, and lentigines, consistent with photodamage.  Biopsy report(s) reviewed, confirming the diagnosis.   ASSESSMENT  1) Well Differentiated Squamous Cell Carcinoma of the left dorsal hand/wrist. 2) photodamage 3) solar lentigines   PLAN   1. Due to location, size, histology, or recurrence and the likelihood of subclinical extension as well as the need to conserve normal surrounding tissue, the patient was deemed acceptable for Mohs micrographic surgery (MMS).  The nature and purpose of the procedure, associated benefits and risks including recurrence and scarring, possible complications such as pain, infection, and bleeding, and alternative methods of treatment if appropriate were discussed with the patient during consent. The lesion location was verified by the patient, by reviewing previous notes, pathology reports, and by photographs as well as angulation measurements if available.  Informed consent was reviewed and signed by the patient, and timeout was performed at 10:15 AM. See op note below.  2. For the photodamage and solar lentigines, sun protection discussed/information given on OTC sunscreens, and we recommend continued regular follow-up with primary dermatologist every 6 months or sooner for any growing, bleeding, or changing lesions. 3. Prognosis and future surveillance discussed. 4. Letter with treatment outcome sent to referring provider. 5. Pain acetaminophen /ibuprofen/oxycodone  5 mg  MOHS MICROGRAPHIC SURGERY AND RECONSTRUCTION  Initial size:   1.8 x 1.2 cm Surgical defect/wound size: 2.1 x 1.2 cm Anesthesia:    0.33% lidocaine  with 1:200,000 epinephrine  EBL:    <5 mL Complications:  None Repair type:   Complex SQ suture:   4-0 Vicryl Cutaneous suture:  5-0 Fast Absorbing Gut Final size of the repair: 4.0 cm  Stages: 1  STAGE I: Anesthesia achieved  with 0.5% lidocaine  with 1:200,000 epinephrine . ChloraPrep applied. 1 section(s) excised using Mohs  technique (this includes total peripheral and deep tissue margin excision and evaluation with frozen sections, excised and interpreted by the same physician). The tumor was first debulked and then excised with an approx. 2mm margin.  Hemostasis was achieved with electrocautery as needed.  The specimen was then oriented, subdivided/relaxed, inked, and processed using Mohs technique.    Frozen section analysis revealed a clear deep and peripheral margin.  Reconstruction  The surgical wound was then cleaned, prepped, and re-anesthetized as above. Wound edges were undermined extensively along at least one entire edge and at a distance equal to or greater than the width of the defect (see wound defect size above) in order to achieve closure and decrease wound tension and anatomic distortion. Redundant tissue repair including standing cone removal was performed. Hemostasis was achieved with electrocautery. Subcutaneous and epidermal tissues were approximated with the above sutures. The surgical site was then lightly scrubbed with sterile, saline-soaked gauze. The area was then bandaged using Vaseline ointment, non-adherent gauze, gauze pads, and tape to provide an adequate pressure dressing. The patient tolerated the procedure well, was given detailed written and verbal wound care instructions, and was discharged in good condition.   The patient will follow-up: 4 weeks.    RUFUS CHRISTELLA HOLY, MD

## 2023-06-10 ENCOUNTER — Encounter: Payer: Self-pay | Admitting: Dermatology

## 2023-07-09 ENCOUNTER — Encounter: Payer: Self-pay | Admitting: Dermatology

## 2023-07-09 ENCOUNTER — Ambulatory Visit: Payer: Medicare Other | Admitting: Dermatology

## 2023-07-09 VITALS — BP 139/78 | HR 75

## 2023-07-09 DIAGNOSIS — L905 Scar conditions and fibrosis of skin: Secondary | ICD-10-CM

## 2023-07-09 DIAGNOSIS — Z8589 Personal history of malignant neoplasm of other organs and systems: Secondary | ICD-10-CM

## 2023-07-09 DIAGNOSIS — T1490XD Injury, unspecified, subsequent encounter: Secondary | ICD-10-CM

## 2023-07-09 DIAGNOSIS — Z85828 Personal history of other malignant neoplasm of skin: Secondary | ICD-10-CM | POA: Diagnosis not present

## 2023-07-09 NOTE — Patient Instructions (Addendum)

## 2023-07-09 NOTE — Progress Notes (Addendum)
   Follow Up Visit   Subjective  Michelle Lutz is a 83 y.o. female who presents for the following: follow up from Mohs surgery   The patient presents for follow up from Mohs surgery for a SCC on the left medial dorsal hand, treated on 06/09/23, repaired with linear closure. The patient has been bandaging the wound as directed. The endorse the following concerns: none   The following portions of the chart were reviewed this encounter and updated as appropriate: medications, allergies, medical history  Review of Systems:  No other skin or systemic complaints except as noted in HPI or Assessment and Plan.  Objective  Well appearing patient in no apparent distress; mood and affect are within normal limits.  A full examination was performed including scalp, head, face and left medial dorsal hand. All findings within normal limits unless otherwise noted below.  Healing wound with mild erythema  Relevant physical exam findings are noted in the Assessment and Plan.    Assessment & Plan    Healing s/p Mohs for SCC, treated on left medial dorsal hand on 06/09/2023, repaired with linear closure - Reassured that wound is healing well - No evidence of infection - No swelling, induration, purulence, dehiscence, or tenderness out of proportion to the clinical exam, see photo above - Discussed that scars take up to 12 months to mature from the date of surgery - Recommend SPF 30+ to scar daily to prevent purple color from UV exposure during scar maturation process - Discussed that erythema and raised appearance of scar will fade over the next 4-6 months - OK to start scar massage at 4-6 weeks post-op - Can consider silicone based products for scar healing starting at 6 weeks post-op  HISTORY OF SQUAMOUS CELL CARCINOMA OF THE SKIN - No evidence of recurrence today - No lymphadenopathy - Recommend regular full body skin exams - Recommend daily broad spectrum sunscreen SPF 30+ to sun-exposed  areas, reapply every 2 hours as needed.  - Call if any new or changing lesions are noted between office visits  Return if symptoms worsen or fail to improve.  I, Tillie Fantasia, CMA, am acting as scribe for Gwenith Daily, MD.   Documentation: I have reviewed the above documentation for accuracy and completeness, and I agree with the above.  Gwenith Daily, MD

## 2023-09-14 ENCOUNTER — Telehealth: Payer: Self-pay

## 2023-09-14 NOTE — Telephone Encounter (Signed)
 Patient called into office stating at her incision site on her hand, her incision line I now red and she can feel stiches protruding out of her site. She stated she did go to [facility] and they would not look at the site because they did not complete her surgery and could not give medical advice. I recommended pt to send our office an update Mychart with a picture attached for Dr. Fain Home to review prior to her coming in for a follow up just to save her a trip. Patient stated she would try to up load a photo but if she is unable to, she will call to request an appointment. I voiced understanding.

## 2023-10-08 ENCOUNTER — Encounter: Payer: Self-pay | Admitting: Dermatology

## 2023-10-08 ENCOUNTER — Ambulatory Visit: Payer: Medicare Other | Admitting: Dermatology

## 2023-10-08 DIAGNOSIS — I8393 Asymptomatic varicose veins of bilateral lower extremities: Secondary | ICD-10-CM

## 2023-10-08 DIAGNOSIS — W908XXA Exposure to other nonionizing radiation, initial encounter: Secondary | ICD-10-CM

## 2023-10-08 DIAGNOSIS — Z79899 Other long term (current) drug therapy: Secondary | ICD-10-CM

## 2023-10-08 DIAGNOSIS — C4491 Basal cell carcinoma of skin, unspecified: Secondary | ICD-10-CM

## 2023-10-08 DIAGNOSIS — C4401 Basal cell carcinoma of skin of lip: Secondary | ICD-10-CM

## 2023-10-08 DIAGNOSIS — L821 Other seborrheic keratosis: Secondary | ICD-10-CM

## 2023-10-08 DIAGNOSIS — D492 Neoplasm of unspecified behavior of bone, soft tissue, and skin: Secondary | ICD-10-CM

## 2023-10-08 DIAGNOSIS — Z85828 Personal history of other malignant neoplasm of skin: Secondary | ICD-10-CM

## 2023-10-08 DIAGNOSIS — L820 Inflamed seborrheic keratosis: Secondary | ICD-10-CM

## 2023-10-08 DIAGNOSIS — Z7189 Other specified counseling: Secondary | ICD-10-CM

## 2023-10-08 DIAGNOSIS — Z1283 Encounter for screening for malignant neoplasm of skin: Secondary | ICD-10-CM

## 2023-10-08 DIAGNOSIS — L719 Rosacea, unspecified: Secondary | ICD-10-CM

## 2023-10-08 DIAGNOSIS — L814 Other melanin hyperpigmentation: Secondary | ICD-10-CM

## 2023-10-08 DIAGNOSIS — C44319 Basal cell carcinoma of skin of other parts of face: Secondary | ICD-10-CM | POA: Diagnosis not present

## 2023-10-08 DIAGNOSIS — D1801 Hemangioma of skin and subcutaneous tissue: Secondary | ICD-10-CM

## 2023-10-08 DIAGNOSIS — L578 Other skin changes due to chronic exposure to nonionizing radiation: Secondary | ICD-10-CM

## 2023-10-08 DIAGNOSIS — D229 Melanocytic nevi, unspecified: Secondary | ICD-10-CM

## 2023-10-08 DIAGNOSIS — Z8589 Personal history of malignant neoplasm of other organs and systems: Secondary | ICD-10-CM

## 2023-10-08 HISTORY — DX: Basal cell carcinoma of skin, unspecified: C44.91

## 2023-10-08 MED ORDER — METRONIDAZOLE 0.75 % EX GEL: 1.0000 | Freq: Two times a day (BID) | CUTANEOUS | 11 refills | Status: AC

## 2023-10-08 NOTE — Progress Notes (Signed)
 Follow-Up Visit   Subjective  Michelle Lutz is a 83 y.o. female who presents for the following: Skin Cancer Screening and Full Body Skin Exam hx of BCCs, SCC, Aks, check spots face no symptoms  The patient presents for Total-Body Skin Exam (TBSE) for skin cancer screening and mole check. The patient has spots, moles and lesions to be evaluated, some may be new or changing and the patient may have concern these could be cancer.    The following portions of the chart were reviewed this encounter and updated as appropriate: medications, allergies, medical history  Review of Systems:  No other skin or systemic complaints except as noted in HPI or Assessment and Plan.  Objective  Well appearing patient in no apparent distress; mood and affect are within normal limits.  A full examination was performed including scalp, head, eyes, ears, nose, lips, neck, chest, axillae, abdomen, back, buttocks, bilateral upper extremities, bilateral lower extremities, hands, feet, fingers, toes, fingernails, and toenails. All findings within normal limits unless otherwise noted below.   Relevant physical exam findings are noted in the Assessment and Plan.  R chest x 1, L popliteal x 1, L neck x 1 (3) Stuck on waxy paps with erythema R upper lip 0.7cm pearly pap  R chin 0.7cm pearly pap   Assessment & Plan   SKIN CANCER SCREENING PERFORMED TODAY.  ACTINIC DAMAGE - Chronic condition, secondary to cumulative UV/sun exposure - diffuse scaly erythematous macules with underlying dyspigmentation - Recommend daily broad spectrum sunscreen SPF 30+ to sun-exposed areas, reapply every 2 hours as needed.  - Staying in the shade or wearing long sleeves, sun glasses (UVA+UVB protection) and wide brim hats (4-inch brim around the entire circumference of the hat) are also recommended for sun protection.  - Call for new or changing lesions.  LENTIGINES, SEBORRHEIC KERATOSES, HEMANGIOMAS - Benign normal skin  lesions - Benign-appearing - Call for any changes  MELANOCYTIC NEVI - Tan-brown and/or pink-flesh-colored symmetric macules and papules - Benign appearing on exam today - Observation - Call clinic for new or changing moles - Recommend daily use of broad spectrum spf 30+ sunscreen to sun-exposed areas.   HISTORY OF BASAL CELL CARCINOMA OF THE SKIN - No evidence of recurrence today - Recommend regular full body skin exams - Recommend daily broad spectrum sunscreen SPF 30+ to sun-exposed areas, reapply every 2 hours as needed.  - Call if any new or changing lesions are noted between office visits  - L tempole, R temple, forehead, nose  HISTORY OF SQUAMOUS CELL CARCINOMA OF THE SKIN - No evidence of recurrence today - No lymphadenopathy - Recommend regular full body skin exams - Recommend daily broad spectrum sunscreen SPF 30+ to sun-exposed areas, reapply every 2 hours as needed.  - Call if any new or changing lesions are noted between office visits - L medial dorsal hand  HISTORY OF PRECANCEROUS ACTINIC KERATOSIS - site(s) of PreCancerous Actinic Keratosis clear today. - these may recur and new lesions may form requiring treatment to prevent transformation into skin cancer - observe for new or changing spots and contact [facility] for appointment if occur - photoprotection with sun protective clothing; sunglasses and broad spectrum sunscreen with SPF of at least 30 + and frequent self skin exams recommended - yearly exams by a dermatologist recommended for persons with history of PreCancerous Actinic Keratoses   ROSACEA face Exam Mid face erythema with telangiectasias  inflammatory papules nose  Chronic and persistent condition with  duration or expected duration over one year. Condition is bothersome/symptomatic for patient. Currently flared.   Rosacea is a chronic progressive skin condition usually affecting the face of adults, causing redness and/or acne bumps. It  is treatable but not curable. It sometimes affects the eyes (ocular rosacea) as well. It may respond to topical and/or systemic medication and can flare with stress, sun exposure, alcohol, exercise, topical steroids (including hydrocortisone/cortisone 10) and some foods.  Daily application of broad spectrum spf 30+ sunscreen to face is recommended to reduce flares.  Patient denies grittiness of the eyes  Treatment Plan Start Metrogel 0.75% bid to face  Long term medication management.  Patient is using long term (months to years) prescription medication  to control their dermatologic condition.  These medications require periodic monitoring to evaluate for efficacy and side effects and may require periodic laboratory monitoring.   Varicose Veins/Spider Veins Legs  - Dilated blue, purple or red veins at the lower extremities - Reassured - Smaller vessels can be treated by sclerotherapy (a procedure to inject a medicine into the veins to make them disappear) if desired, but the treatment is not covered by insurance. Larger vessels may be covered if symptomatic and we would refer to vascular surgeon if treatment desired.  INFLAMED SEBORRHEIC KERATOSIS (3) R chest x 1, L popliteal x 1, L neck x 1 (3) Symptomatic, irritating, patient would like treated. Destruction of lesion - R chest x 1, L popliteal x 1, L neck x 1 (3) Complexity: simple   Destruction method: cryotherapy   Informed consent: discussed and consent obtained   Timeout:  patient name, date of birth, surgical site, and procedure verified Lesion destroyed using liquid nitrogen: Yes   Region frozen until ice ball extended beyond lesion: Yes   Outcome: patient tolerated procedure well with no complications   Post-procedure details: wound care instructions given   NEOPLASM OF SKIN (2) R upper lip Epidermal / dermal shaving  Lesion diameter (cm):  0.7 Informed consent: discussed and consent obtained   Timeout: patient name, date  of birth, surgical site, and procedure verified   Procedure prep:  Patient was prepped and draped in usual sterile fashion Prep type:  Isopropyl alcohol Anesthesia: the lesion was anesthetized in a standard fashion   Anesthetic:  1% lidocaine  w/ epinephrine  1-100,000 buffered w/ 8.4% NaHCO3 Instrument used: DermaBlade   Hemostasis achieved with: pressure, aluminum chloride and electrodesiccation   Outcome: patient tolerated procedure well   Post-procedure details: sterile dressing applied and wound care instructions given   Dressing type: bandage and bacitracin    Destruction of lesion Complexity: extensive   Destruction method: electrodesiccation and curettage   Informed consent: discussed and consent obtained   Timeout:  patient name, date of birth, surgical site, and procedure verified Procedure prep:  Patient was prepped and draped in usual sterile fashion Prep type:  Isopropyl alcohol Anesthesia: the lesion was anesthetized in a standard fashion   Anesthetic:  1% lidocaine  w/ epinephrine  1-100,000 buffered w/ 8.4% NaHCO3 Curettage performed in three different directions: Yes   Electrodesiccation performed over the curetted area: Yes   Lesion length (cm):  0.7 Lesion width (cm):  0.7 Margin per side (cm):  0.2 Final wound size (cm):  1.1 Hemostasis achieved with:  pressure, aluminum chloride and electrodesiccation Outcome: patient tolerated procedure well with no complications   Post-procedure details: sterile dressing applied and wound care instructions given   Dressing type: bandage and bacitracin   Specimen 1 - Surgical pathology Differential  Diagnosis: R/O BCC  Check Margins: No 0.7cm pearly pap EDC R chin Epidermal / dermal shaving  Lesion diameter (cm):  0.7 Informed consent: discussed and consent obtained   Timeout: patient name, date of birth, surgical site, and procedure verified   Procedure prep:  Patient was prepped and draped in usual sterile fashion Prep  type:  Isopropyl alcohol Anesthesia: the lesion was anesthetized in a standard fashion   Anesthetic:  1% lidocaine  w/ epinephrine  1-100,000 buffered w/ 8.4% NaHCO3 Instrument used: DermaBlade   Hemostasis achieved with: pressure, aluminum chloride and electrodesiccation   Outcome: patient tolerated procedure well   Post-procedure details: sterile dressing applied and wound care instructions given   Dressing type: bandage and bacitracin    Destruction of lesion Complexity: extensive   Destruction method: electrodesiccation and curettage   Informed consent: discussed and consent obtained   Timeout:  patient name, date of birth, surgical site, and procedure verified Procedure prep:  Patient was prepped and draped in usual sterile fashion Prep type:  Isopropyl alcohol Anesthesia: the lesion was anesthetized in a standard fashion   Anesthetic:  1% lidocaine  w/ epinephrine  1-100,000 buffered w/ 8.4% NaHCO3 Curettage performed in three different directions: Yes   Electrodesiccation performed over the curetted area: Yes   Lesion length (cm):  0.7 Lesion width (cm):  0.7 Margin per side (cm):  0.2 Final wound size (cm):  1.1 Hemostasis achieved with:  pressure, aluminum chloride and electrodesiccation Outcome: patient tolerated procedure well with no complications   Post-procedure details: sterile dressing applied and wound care instructions given   Dressing type: bandage and bacitracin   Specimen 2 - Surgical pathology Differential Diagnosis: R/O BCC  Check Margins: No 0.7cm pearly pap EDC Return in about 1 year (around 10/07/2024) for TBSE, Hx of BCC, Hx of SCC, Hx of AKs.  I, Rollie Clipper, RMA, am acting as scribe for Celine Collard, MD .   Documentation: I have reviewed the above documentation for accuracy and completeness, and I agree with the above.  Celine Collard, MD

## 2023-10-08 NOTE — Patient Instructions (Addendum)

## 2023-10-12 ENCOUNTER — Ambulatory Visit: Payer: Self-pay | Admitting: Dermatology

## 2023-10-12 LAB — SURGICAL PATHOLOGY

## 2023-10-13 ENCOUNTER — Encounter: Payer: Self-pay | Admitting: Dermatology

## 2023-10-13 NOTE — Telephone Encounter (Signed)
-----   Message from Celine Collard sent at 10/12/2023  6:03 PM EDT ----- FINAL DIAGNOSIS        1. Skin, R upper lip :       BASAL CELL CARCINOMA, INFUNDIBULOCYSTIC TYPE        2. Skin, R chin :       BASAL CELL CARCINOMA, NODULAR PATTERN   1&2 - Both Cancer = BCC Bother already treated Recheck next visit

## 2023-10-13 NOTE — Telephone Encounter (Signed)
 Left pt msg to call for bx results/sh

## 2023-10-15 NOTE — Telephone Encounter (Addendum)
 Tried calling patient regarding results. No answer. Lm for patient to return call.   ----- Message from Celine Collard sent at 10/12/2023  6:03 PM EDT ----- FINAL DIAGNOSIS        1. Skin, R upper lip :       BASAL CELL CARCINOMA, INFUNDIBULOCYSTIC TYPE        2. Skin, R chin :       BASAL CELL CARCINOMA, NODULAR PATTERN   1&2 - Both Cancer = BCC Bother already treated Recheck next visit

## 2023-10-20 NOTE — Telephone Encounter (Addendum)
 Tried calling patient regarding results. No answer. LM for patient to return call. ----- Message from Celine Collard sent at 10/12/2023  6:03 PM EDT ----- FINAL DIAGNOSIS        1. Skin, R upper lip :       BASAL CELL CARCINOMA, INFUNDIBULOCYSTIC TYPE        2. Skin, R chin :       BASAL CELL CARCINOMA, NODULAR PATTERN   1&2 - Both Cancer = BCC Bother already treated Recheck next visit

## 2023-10-22 NOTE — Telephone Encounter (Signed)
-----   Message from Celine Collard sent at 10/12/2023  6:03 PM EDT ----- FINAL DIAGNOSIS        1. Skin, R upper lip :       BASAL CELL CARCINOMA, INFUNDIBULOCYSTIC TYPE        2. Skin, R chin :       BASAL CELL CARCINOMA, NODULAR PATTERN   1&2 - Both Cancer = BCC Bother already treated Recheck next visit

## 2023-10-22 NOTE — Telephone Encounter (Signed)
 Left voicemail to return my call

## 2023-12-28 NOTE — Progress Notes (Unsigned)
 Cardiology Office Note  Date:  12/29/2023   ID:  Michelle Lutz, Michelle Lutz 05-01-1941, MRN [id number]  PCP:  Bertrum Charlie CROME, MD   Chief Complaint  Patient presents with   12 month follow up     Patient c/o bilateral LE edema at times & has noticed some weakness and a decrease in blood pressure at times.     HPI:  [name] is a 83 year old woman with past medical history of Hypertension Spinal stenosis Hyperlipidemia OSA on CPAP Cath 2003: no CAD Syncope, vasovagal (better off verapamil) F/u of her hypertension, near syncope /syncope  LOV 7/24 In follow-up she reports that she is doing well Blood pressure numbers reviewed ranging 120 up to 140 systolic on average, rare 150 and higher pressure  Denies significant near-syncope or syncope Reports that she is active at baseline No leg swelling  Takes HCTZ periodically for elevated pressure over 140 Daily takes Cardura  1 mg twice daily, irbesartan  300 daily  Sedentary, no regular exercise  EKG personally reviewed by myself on todays visit EKG Interpretation Date/Time:  Tuesday December 29 2023 15:50:27 EDT Ventricular Rate:  68 PR Interval:  184 QRS Duration:  96 QT Interval:  396 QTC Calculation: 421 R Axis:   -8  Text Interpretation: Normal sinus rhythm Minimal voltage criteria for LVH, may be normal variant ( R in aVL ) When compared with ECG of 23-Dec-2022 11:56, Premature ventricular complexes are no longer Present Confirmed by Perla Lye [phone]) on 12/29/2023 4:56:12 PM    Lab Results  Component Value Date   CHOL 167 01/08/2022   HDL 69 01/08/2022   LDLCALC 85 01/08/2022   TRIG 66 01/08/2022    PMH:   has a past medical history of Actinic keratosis, Allergy, Arthritis, Basal cell carcinoma, Basal cell carcinoma (10/08/2023), Basal cell carcinoma (10/08/2023), Cataract, GERD (gastroesophageal reflux disease), Hypertension, Osteoarthritis, SCC (squamous cell carcinoma) (04/08/2023), Sleep apnea, and Spinal  stenosis.  PSH:    Past Surgical History:  Procedure Laterality Date   breast biopsy Left 04/02/2022   u/s bx coil clip path pending   BREAST BIOPSY Left 04/02/2022   US  LT BREAST BX W LOC DEV 1ST LESION IMG BX SPEC US  GUIDE 04/02/2022 [facility]   BREAST CYST ASPIRATION     20+ years   BREAST EXCISIONAL BIOPSY Right    1980's   CESAREAN SECTION     EYE SURGERY     FOOT SURGERY     JOINT REPLACEMENT Right 07/07/2016   Partial R. knee in 2008 then complete in 2018   uterine prolapse repair     Tacking uterus    Current Outpatient Medications  Medication Sig Dispense Refill   Biotin 10 MG CAPS Take 10 mg by mouth daily.      Calcium Carb-Cholecalciferol (CALCIUM CARBONATE-VITAMIN D3) 600-400 MG-UNIT TABS Take 1 tablet by mouth 2 (two) times daily.     Calcium Carbonate-Vit D-Min (CALCIUM 600+D PLUS MINERALS) 600-400 MG-UNIT TABS Take 1 tablet by mouth 2 (two) times daily.     Calcium Polycarbophil (FIBER) 625 MG TABS Take by mouth.     carboxymethylcellulose (REFRESH PLUS) 0.5 % SOLN Place 1 drop into both eyes 4 (four) times daily as needed (dry eyes).      cetirizine (ZYRTEC) 10 MG tablet Take 10 mg by mouth daily.     Cholecalciferol 25 MCG (1000 UT) tablet Take 1,000 Units by mouth daily.      Cranberry 450 MG TABS Take 450 mg by  mouth daily.     diphenoxylate-atropine (LOMOTIL) 2.5-0.025 MG tablet Take 1 tablet by mouth 4 (four) times daily as needed for diarrhea or loose stools.     docusate sodium (COLACE) 50 MG capsule Take 50 mg by mouth daily as needed.     doxazosin  (CARDURA ) 1 MG tablet Take 1 tablet (1 mg total) by mouth 2 (two) times daily. 180 tablet 3   fluticasone  (FLONASE ) 50 MCG/ACT nasal spray Use 2 spray(s) in each nostril once daily 16 g 0   gabapentin  (NEURONTIN ) 100 MG capsule Take 1 capsule (100 mg total) by mouth 3 (three) times daily. 270 capsule 1   hydrochlorothiazide  (MICROZIDE ) 12.5 MG capsule Take 1 capsule (12.5 mg total) by mouth daily. 90  capsule 1   HYDROcodone-acetaminophen  (NORCO) 7.5-325 MG tablet Take 1 tablet by mouth every 6 (six) hours as needed for moderate pain or severe pain.     ipratropium (ATROVENT ) 0.06 % nasal spray Place 2 sprays into both nostrils 2 (two) times daily. 15 mL 5   irbesartan  (AVAPRO ) 300 MG tablet Take 1 tablet by mouth once daily 90 tablet 3   metroNIDAZOLE  (METROGEL ) 0.75 % gel Apply 1 Application topically 2 (two) times daily. Bid to face for rosacea 45 g 11   mometasone  (ELOCON ) 0.1 % cream Apply 1 Application topically in the morning and at bedtime. Bid to aa rash on face until clear, then prn flares 15 g 0   montelukast  (SINGULAIR ) 10 MG tablet Take 1 tablet (10 mg total) by mouth at bedtime. 90 tablet 3   Multiple Vitamin (MULTIVITAMIN) capsule Take 1 capsule by mouth daily.     Multiple Vitamins-Minerals (PRESERVISION AREDS 2 PO) Take by mouth.     mupirocin  ointment (BACTROBAN ) 2 % Apply 1 Application topically 2 (two) times daily. 22 g 0   omeprazole (PRILOSEC) 20 MG capsule Take 20 mg by mouth daily.     oxycodone  (OXY-IR) 5 MG capsule Take 1 capsule (5 mg total) by mouth every 6 (six) hours as needed for up to 8 doses. 8 capsule 0   pravastatin  (PRAVACHOL ) 40 MG tablet Take 1 tablet by mouth once daily 90 tablet 3   Probiotic Product (ALIGN) 4 MG CAPS Take 4 mg by mouth daily.      sodium chloride (MURO 128) 5 % ophthalmic ointment Place 1 application into both eyes as needed for eye irritation.      zolpidem (AMBIEN) 5 MG tablet Take 5 mg by mouth at bedtime as needed for sleep. (Patient not taking: Reported on 12/29/2023)     No current facility-administered medications for this visit.     Allergies:   Aspirin, Benazepril, and Simvastatin   Social History:  The patient  reports that she has never smoked. She has never used smokeless tobacco.  No alcohol use . she reports current drug use.   Family History:   family history includes Arthritis in her brother and father; COPD in her  father; Heart disease in her brother, father, and mother; Hypertension in her brother; Kidney disease in her mother; Macular degeneration in her mother.    Review of Systems: Review of Systems  Constitutional: Negative.   HENT: Negative.    Respiratory: Negative.    Cardiovascular: Negative.   Gastrointestinal: Negative.   Musculoskeletal: Negative.   Neurological:  Positive for dizziness.  Psychiatric/Behavioral: Negative.    All other systems reviewed and are negative.   PHYSICAL EXAM: VS:  BP (!) 150/80 (BP Location: Left Arm,  Patient Position: Sitting, Cuff Size: Large)   Pulse 68   Ht 5' 3 (1.6 m)   Wt 178 lb (80.7 kg)   SpO2 96%   BMI 31.53 kg/m  , BMI Body mass index is 31.53 kg/m. Constitutional:  oriented to person, place, and time. No distress.  HENT:  Head: Grossly normal Eyes:  no discharge. No scleral icterus.  Neck: No JVD, no carotid bruits  Cardiovascular: Regular rate and rhythm, no murmurs appreciated Pulmonary/Chest: Clear to auscultation bilaterally, no wheezes or rales Abdominal: Soft.  no distension.  no tenderness.  Musculoskeletal: Normal range of motion Neurological:  normal muscle tone. Coordination normal. No atrophy Skin: Skin warm and dry Psychiatric: normal affect, pleasant   Recent Labs: No results found for requested labs within last 365 days.    Lipid Panel Lab Results  Component Value Date   CHOL 167 01/08/2022   HDL 69 01/08/2022   LDLCALC 85 01/08/2022   TRIG 66 01/08/2022      Wt Readings from Last 3 Encounters:  12/29/23 178 lb (80.7 kg)  12/23/22 174 lb 4 oz (79 kg)  01/16/22 176 lb 14.4 oz (80.2 kg)       ASSESSMENT AND PLAN:  Problem List Items Addressed This Visit       Cardiology Problems   Hyperlipidemia     Other   Sleep apnea   Other Visit Diagnoses       Syncope and collapse    -  Primary     Essential hypertension       Relevant Orders   EKG 12-Lead (Completed)     OSA on CPAP            Syncope/near syncope Long history previous work-up with cardiology No recent episodes of near syncope or syncope Rare episodes of weakness -Blood pressure stable on doxazosin  to 1 mg twice daily  irbesartan  300 daily Only taking HCTZ 12.5 daily as needed for blood pressure over 140 Recommend she stay hydrated when feeling weak  Essential hypertension Blood pressure is well controlled on today's visit. No changes made to the medications.  Hyperlipidemia On pravastatin  No ischemic work-up needed at this time Total cholesterol 160  Obstructive sleep apnea On CPAP    Signed, Velinda Lunger, M.D., Ph.D. [facility] [place], [place] [phone]

## 2023-12-29 ENCOUNTER — Encounter: Payer: Self-pay | Admitting: Cardiovascular Disease

## 2023-12-29 ENCOUNTER — Ambulatory Visit: Attending: Cardiovascular Disease | Admitting: Cardiovascular Disease

## 2023-12-29 VITALS — BP 150/80 | HR 68 | Ht 63.0 in | Wt 178.0 lb

## 2023-12-29 DIAGNOSIS — G4733 Obstructive sleep apnea (adult) (pediatric): Secondary | ICD-10-CM | POA: Diagnosis not present

## 2023-12-29 DIAGNOSIS — I10 Essential (primary) hypertension: Secondary | ICD-10-CM | POA: Diagnosis not present

## 2023-12-29 DIAGNOSIS — R55 Syncope and collapse: Secondary | ICD-10-CM | POA: Diagnosis not present

## 2023-12-29 DIAGNOSIS — E782 Mixed hyperlipidemia: Secondary | ICD-10-CM | POA: Diagnosis not present

## 2023-12-29 DIAGNOSIS — E785 Hyperlipidemia, unspecified: Secondary | ICD-10-CM

## 2023-12-29 MED ORDER — IRBESARTAN 300 MG PO TABS: 300.0000 mg | ORAL_TABLET | Freq: Every day | ORAL | 3 refills | Status: AC

## 2023-12-29 MED ORDER — DOXAZOSIN MESYLATE 1 MG PO TABS: 1.0000 mg | ORAL_TABLET | Freq: Two times a day (BID) | ORAL | 3 refills | Status: AC

## 2023-12-29 MED ORDER — PRAVASTATIN SODIUM 40 MG PO TABS: 40.0000 mg | ORAL_TABLET | Freq: Every day | ORAL | 3 refills | Status: AC

## 2023-12-29 MED ORDER — HYDROCHLOROTHIAZIDE 12.5 MG PO CAPS: 12.5000 mg | ORAL_CAPSULE | Freq: Every day | ORAL | 3 refills | Status: AC

## 2023-12-29 NOTE — Patient Instructions (Signed)

## 2024-01-16 ENCOUNTER — Ambulatory Visit
Admission: EM | Admit: 2024-01-16 | Discharge: 2024-01-16 | Disposition: A | Discharge status: Home / Self Care | Attending: Emergency Medicine | Admitting: Emergency Medicine

## 2024-01-16 DIAGNOSIS — U071 COVID-19: Secondary | ICD-10-CM | POA: Diagnosis not present

## 2024-01-16 LAB — POC SOFIA SARS ANTIGEN FIA: SARS Coronavirus 2 Ag: POSITIVE — AB

## 2024-01-16 NOTE — ED Triage Notes (Signed)
 Patient to Urgent Care with complaints of feeling run down/ sore throat/ fevers (max 100.3)/ nasal and chest congestion/ cough/  Symptoms started yesterday.   Mucinex 600mg / tylenol  last night.

## 2024-01-16 NOTE — Discharge Instructions (Addendum)
 Your COVID test is positive.    Follow up with your primary care provider on Monday.  Go to the emergency department if you have worsening symptoms.    Take Tylenol  as needed for fever or discomfort.  Take plain Mucinex as needed for congestion.

## 2024-01-16 NOTE — ED Provider Notes (Signed)
 Michelle Lutz    CSN: [id number] Arrival date & time: 01/16/24  1130      History   Chief Complaint Chief Complaint  Patient presents with   Fever   Cough    HPI Michelle Lutz is a 83 y.o. female.  Patient presents with 1 day history of fever, fatigue, congestion, sore throat, cough.  Tmax 100.3.  She has been treating her symptoms with Tylenol  and Mucinex.  She denies chest pain, shortness of breath, vomiting, diarrhea.  The history is provided by the patient and medical records.    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   Benign essential hypertension 04/09/2016   Chronic rhinitis 04/09/2016   Colon polyps 04/09/2016   Cyst of left kidney 04/09/2016   Osteoarthritis 04/09/2016   Osteopenia 04/09/2016   Urticaria 04/09/2016   GI bleed 01/26/2013   Hyperlipidemia 01/21/2006   Sleep apnea 07/16/2000   Allergic rhinitis 06/09/2000    Past Surgical History:   Procedure Laterality Date   breast biopsy Left 04/02/2022   u/s bx coil clip path pending   BREAST BIOPSY Left 04/02/2022   US  LT BREAST BX W LOC DEV 1ST LESION IMG BX SPEC US  GUIDE 04/02/2022 [facility]   BREAST CYST ASPIRATION     20+ years   BREAST EXCISIONAL BIOPSY Right    1980's   CESAREAN SECTION     EYE SURGERY     FOOT SURGERY     JOINT REPLACEMENT Right 07/07/2016   Partial R. knee in 2008 then complete in 2018   uterine prolapse repair     Tacking uterus    OB History   No obstetric history on file.      Home Medications    Prior to Admission medications   Medication Sig Start Date End Date Taking? Authorizing Provider  Biotin 10 MG CAPS Take 10 mg by mouth daily.     [provider]  Calcium Carb-Cholecalciferol (CALCIUM CARBONATE-VITAMIN D3) 600-400 MG-UNIT TABS Take 1 tablet by mouth 2 (two) times daily.    [provider]  Calcium Carbonate-Vit D-Min (CALCIUM 600+D PLUS MINERALS) 600-400 MG-UNIT TABS Take 1 tablet by mouth 2 (two) times daily.    [provider]  Calcium Polycarbophil (FIBER) 625 MG TABS Take by mouth.    [provider]  carboxymethylcellulose (REFRESH PLUS) 0.5 %  SOLN Place 1 drop into both eyes 4 (four) times daily as needed (dry eyes).     [provider]  cetirizine (ZYRTEC) 10 MG tablet Take 10 mg by mouth daily.    [provider]  Cholecalciferol 25 MCG (1000 UT) tablet Take 1,000 Units by mouth daily.     [provider]  Cranberry 450 MG TABS Take 450 mg by mouth daily.    [provider]  diphenoxylate-atropine (LOMOTIL) 2.5-0.025 MG tablet Take 1 tablet by mouth 4 (four) times daily as needed for diarrhea or loose stools.    [provider]  docusate sodium (COLACE) 50 MG capsule Take 50 mg by mouth daily as needed.    [provider]  doxazosin  (CARDURA ) 1 MG tablet Take 1 tablet (1 mg total) by mouth 2 (two) times daily. 12/29/23   Gollan,  Timothy J, MD  fluticasone  (FLONASE ) 50 MCG/ACT nasal spray Use 2 spray(s) in each nostril once daily 06/30/22   Bacigalupo, Angela M, MD  gabapentin  (NEURONTIN ) 100 MG capsule Take 1 capsule (100 mg total) by mouth 3 (three) times daily. 04/10/22   Simmons-Robinson, Rockie, MD  hydrochlorothiazide  (MICROZIDE ) 12.5 MG capsule Take 1 capsule (12.5 mg total) by mouth daily. 12/29/23   Gollan, Timothy J, MD  HYDROcodone-acetaminophen  (NORCO) 7.5-325 MG tablet Take 1 tablet by mouth every 6 (six) hours as needed for moderate pain or severe pain.    [provider]  ipratropium (ATROVENT ) 0.06 % nasal spray Place 2 sprays into both nostrils 2 (two) times daily. 02/14/20   Bertrum Charlie CROME, MD  irbesartan  (AVAPRO ) 300 MG tablet Take 1 tablet (300 mg total) by mouth daily. 12/29/23   Gollan, Timothy J, MD  metroNIDAZOLE  (METROGEL ) 0.75 % gel Apply 1 Application topically 2 (two) times daily. Bid to face for rosacea 10/08/23 10/07/24  Hester Alm BROCKS, MD  mometasone  (ELOCON ) 0.1 % cream Apply 1 Application topically in the morning and at bedtime. Bid to aa rash on face until clear, then prn flares 10/08/22   Hester Alm BROCKS, MD  montelukast  (SINGULAIR ) 10 MG tablet Take 1 tablet (10 mg total) by mouth at bedtime. 07/20/21   Bertrum Charlie CROME, MD  Multiple Vitamin (MULTIVITAMIN) capsule Take 1 capsule by mouth daily.    [provider]  Multiple Vitamins-Minerals (PRESERVISION AREDS 2 PO) Take by mouth.    [provider]  mupirocin  ointment (BACTROBAN ) 2 % Apply 1 Application topically 2 (two) times daily. 06/09/23   Paci, Karina M, MD  omeprazole (PRILOSEC) 20 MG capsule Take 20 mg by mouth daily.    [provider]  oxycodone  (OXY-IR) 5 MG capsule Take 1 capsule (5 mg total) by mouth every 6 (six) hours as needed for up to 8 doses. 06/09/23   Paci, Karina M, MD  pravastatin  (PRAVACHOL ) 40 MG tablet Take 1 tablet (40 mg total) by mouth daily. 12/29/23   Gollan, Timothy J, MD   Probiotic Product (ALIGN) 4 MG CAPS Take 4 mg by mouth daily.     [provider]  sodium chloride (MURO 128) 5 % ophthalmic ointment Place 1 application into both eyes as needed for eye irritation.     [provider]  zolpidem (AMBIEN) 5 MG tablet Take 5 mg by mouth at bedtime as needed for sleep. Patient not taking: Reported on 12/29/2023    [provider]    Family History Family History  Problem Relation Age of Onset   Heart disease Mother  Kidney disease Mother    Macular degeneration Mother    Arthritis Father    Heart disease Father    COPD Father    Arthritis Brother    Heart disease Brother    Hypertension Brother    Breast cancer Neg Hx     Social History Social History   Tobacco Use   Smoking status: Never   Smokeless tobacco: Never  Vaping Use   Vaping status: Never Used  Substance Use Topics   Alcohol use: Not Currently   Drug use: Yes    Comment: prescribed hydrocodone     Allergies   Aspirin, Benazepril, and Simvastatin   Review of Systems Review of Systems  Constitutional:  Positive for fatigue and fever. Negative for chills.  HENT:  Positive for congestion and sore throat. Negative for ear pain.   Respiratory:  Positive for cough. Negative for shortness of breath.   Cardiovascular:  Negative for chest pain and palpitations.  Gastrointestinal:  Negative for diarrhea and vomiting.     Physical Exam Triage Vital Signs ED Triage Vitals  Encounter Vitals Group     BP 01/16/24 1212 (!) 158/78     Girls Systolic BP Percentile --      Girls Diastolic BP Percentile --      Boys Systolic BP Percentile --      Boys Diastolic BP Percentile --      Pulse Rate 01/16/24 1150 86     Resp 01/16/24 1150 19     Temp 01/16/24 1150 98.3 F (36.8 C)     Temp src --      SpO2 01/16/24 1150 96 %     Weight --      Height --      Head Circumference --      Peak Flow --      Pain Score 01/16/24 1150 0     Pain Loc --       Pain Education --      Exclude from Growth Chart --    No data found.  Updated Vital Signs BP (!) 158/78   Pulse 86   Temp 98.3 F (36.8 C)   Resp 19   SpO2 96%   Visual Acuity Right Eye Distance:   Left Eye Distance:   Bilateral Distance:    Right Eye Near:   Left Eye Near:    Bilateral Near:     Physical Exam Constitutional:      General: She is not in acute distress. HENT:     Right Ear: Tympanic membrane normal.     Left Ear: Tympanic membrane normal.     Nose: Rhinorrhea present.     Mouth/Throat:     Mouth: Mucous membranes are moist.     Pharynx: Oropharynx is clear.  Cardiovascular:     Rate and Rhythm: Normal rate and regular rhythm.     Heart sounds: Normal heart sounds.  Pulmonary:     Effort: Pulmonary effort is normal. No respiratory distress.     Breath sounds: Normal breath sounds.  Neurological:     Mental Status: She is alert.      UC Treatments / Results  Labs (all labs ordered are listed, but only abnormal results are displayed) Labs Reviewed  POC SOFIA SARS ANTIGEN FIA - Abnormal; Notable for the following components:      Result Value   SARS Coronavirus 2 Ag Positive (*)    All other components within normal limits  EKG   Radiology No results found.  Procedures Procedures (including critical care time)  Medications Ordered in UC Medications - No data to display  Initial Impression / Assessment and Plan / UC Course  I have reviewed the triage vital signs and the nursing notes.  Pertinent labs & imaging results that were available during my care of the patient were reviewed by me and considered in my medical decision making (see chart for details).    COVID-19.  Rapid COVID test is positive.  Patient is on Cardura ; caution is advised with this medication and Paxlovid.  Instructed patient to follow-up with her PCP on Monday to discuss Paxlovid if she desires.  Education provided on COVID.  ED precautions given.  Instructed  her to continue symptomatic treatment including Tylenol  and plain Mucinex, rest, hydration.  She is agreeable to this plan of care.    Final Clinical Impressions(s) / UC Diagnoses   Final diagnoses:  COVID-19     Discharge Instructions      Your COVID test is positive.    Follow up with your primary care provider on Monday.  Go to the emergency department if you have worsening symptoms.    Take Tylenol  as needed for fever or discomfort.  Take plain Mucinex as needed for congestion.      ED Prescriptions   None    PDMP not reviewed this encounter.   Corlis Burnard DEL, NP 01/16/24 1250

## 2024-05-24 ENCOUNTER — Encounter: Payer: Self-pay | Admitting: Dermatology

## 2024-05-24 ENCOUNTER — Ambulatory Visit: Admitting: Dermatology

## 2024-05-24 DIAGNOSIS — C44319 Basal cell carcinoma of skin of other parts of face: Secondary | ICD-10-CM | POA: Diagnosis not present

## 2024-05-24 DIAGNOSIS — D485 Neoplasm of uncertain behavior of skin: Secondary | ICD-10-CM | POA: Diagnosis not present

## 2024-05-24 NOTE — Progress Notes (Signed)
" ° °  Follow-Up Visit   Subjective  Michelle Lutz is a 83 y.o. female who presents for the following: spot at forehead that came up about 2 months ago. No bleeding, no pain.  Spot at right nose, came up like a pimple about 2 months ago, not sore, no drainage. Spot at left side of face that is smooth but new, brown spot at left forehead/hairline.   The following portions of the chart were reviewed this encounter and updated as appropriate: medications, allergies, medical history  Review of Systems:  No other skin or systemic complaints except as noted in HPI or Assessment and Plan.  Objective  Well appearing patient in no apparent distress; mood and affect are within normal limits.   A focused examination was performed of the following areas: face  Relevant exam findings are noted in the Assessment and Plan.  Glabella 3 mm dark brown papule with telangiectasias    Assessment & Plan    Favor Resolving Acne Bump at right nose Exam: inflamed pink papule on right nose  Treatment Plan: Monitor. RTC if worsening  NEOPLASM OF UNCERTAIN BEHAVIOR OF SKIN (3) Glabella - Skin / nail biopsy Type of biopsy: tangential   Informed consent: discussed and consent obtained   Timeout: patient name, date of birth, surgical site, and procedure verified   Procedure prep:  Patient was prepped and draped in usual sterile fashion Prep type:  Isopropyl alcohol Anesthesia: the lesion was anesthetized in a standard fashion   Anesthetic:  1% lidocaine  w/ epinephrine  1-100,000 buffered w/ 8.4% NaHCO3 Instrument used: DermaBlade   Hemostasis achieved with: pressure and aluminum chloride   Outcome: patient tolerated procedure well   Post-procedure details: sterile dressing applied and wound care instructions given   Dressing type: bandage and petrolatum    Specimen 1 - Surgical pathology Differential Diagnosis: SK vs BCC  Check Margins: No left lateral cheek left superior forehead Biopsy deferred  today at left lateral cheek and left superior forehead. Will recheck at follow up and biopsy if indicated. Patient will call to schedule follow up later.   Return for TBSE, as scheduled, with Dr. MARLA, HxBCC, HxSCC, recheck L cheek, L forehead.  Michelle Lutz Michelle Lutz, RMA, am acting as scribe for Boneta Sharps, MD .   Documentation: I have reviewed the above documentation for accuracy and completeness, and I agree with the above.  Boneta Sharps, MD    "

## 2024-05-24 NOTE — Patient Instructions (Signed)

## 2024-05-30 ENCOUNTER — Ambulatory Visit: Payer: Self-pay | Admitting: Dermatology

## 2024-05-30 LAB — SURGICAL PATHOLOGY

## 2024-05-31 ENCOUNTER — Encounter: Payer: Self-pay | Admitting: Dermatology

## 2024-05-31 NOTE — Telephone Encounter (Signed)
-----   Message from Boneta Sharps, MD sent at 05/30/2024  4:37 PM EST ----- Diagnosis: glabella :       BASAL CELL CARCINOMA, NODULAR PATTERN, PIGMENTED   Please call with diagnosis and determine where the patient would like to have Mohs surgery.  Explanation: your biopsy shows a basal cell skin cancer in the second layer of the skin. This is the most common kind of skin cancer and is caused by damage from sun exposure. Basal cell skin cancers  almost never spread beyond the skin, so they are not dangerous to your overall health. However, they will continue to grow, can bleed, cause nonhealing wounds, and disrupt nearby structures unless  fully treated.  Treatment: Given the location and type of skin cancer, I recommend Mohs surgery. Mohs surgery involves cutting out the skin cancer and then checking under the microscope to ensure the whole skin  cancer was removed. If any skin cancer remains, the surgeon will cut out more until it is fully removed. The cure rate is about 98-99%. Once the Mohs surgeon confirms the skin cancer is out, they  will discuss the options to repair or heal the area. You must take it easy for about two weeks after surgery (no lifting over 10-15 lbs, avoid activity to get your heart rate and blood pressure up).  It is done at another office outside of [place] ([place], [place], or [place]).

## 2024-05-31 NOTE — Telephone Encounter (Signed)
 Left message for patient to return call regarding biopsy results.

## 2024-06-08 NOTE — Telephone Encounter (Signed)
 Left message for patient to return call regarding biopsy results.

## 2024-06-13 NOTE — Telephone Encounter (Signed)
 Patient had called and left a message that she had not heard back on her bx results.  She stated she saw her results on mychart and was wondering if she needed any treatment.  I tried calling patient back and had to leave a voicemail.  I advised that we have tried and left 3 messages for patient to call about bx results.  Advised patient to call the office to discuss treatment that Dr. Claudene recommend for her BCC./sh

## 2024-06-13 NOTE — Telephone Encounter (Signed)
 Left message for patient to return call regarding biopsy results. Lonell RAMAN., RMA

## 2024-06-22 NOTE — Telephone Encounter (Signed)
-----   Message from Boneta Sharps, MD sent at 05/30/2024  4:37 PM EST ----- Diagnosis: glabella :       BASAL CELL CARCINOMA, NODULAR PATTERN, PIGMENTED   Please call with diagnosis and determine where the patient would like to have Mohs surgery.  Explanation: your biopsy shows a basal cell skin cancer in the second layer of the skin. This is the most common kind of skin cancer and is caused by damage from sun exposure. Basal cell skin cancers  almost never spread beyond the skin, so they are not dangerous to your overall health. However, they will continue to grow, can bleed, cause nonhealing wounds, and disrupt nearby structures unless  fully treated.  Treatment: Given the location and type of skin cancer, I recommend Mohs surgery. Mohs surgery involves cutting out the skin cancer and then checking under the microscope to ensure the whole skin  cancer was removed. If any skin cancer remains, the surgeon will cut out more until it is fully removed. The cure rate is about 98-99%. Once the Mohs surgeon confirms the skin cancer is out, they  will discuss the options to repair or heal the area. You must take it easy for about two weeks after surgery (no lifting over 10-15 lbs, avoid activity to get your heart rate and blood pressure up).  It is done at another office outside of [place] ([place], [place], or [place]).

## 2024-06-22 NOTE — Telephone Encounter (Signed)
 Letter mailed out for patient regarding biopsy results.

## 2024-10-13 ENCOUNTER — Ambulatory Visit: Admitting: Dermatology

## 2024-10-25 ENCOUNTER — Ambulatory Visit: Admitting: Dermatology
